# Patient Record
Sex: Female | Born: 1970 | Hispanic: No | Marital: Married | Smoking: Never smoker
Health system: Southern US, Community
[De-identification: ages and names within clinical notes are randomized; demographics above are authoritative.]

## PROBLEM LIST (undated history)

## (undated) ENCOUNTER — Inpatient Hospital Stay (HOSPITAL_COMMUNITY): Payer: Self-pay

## (undated) DIAGNOSIS — O24419 Gestational diabetes mellitus in pregnancy, unspecified control: Secondary | ICD-10-CM

## (undated) HISTORY — DX: Gestational diabetes mellitus in pregnancy, unspecified control: O24.419

---

## 2005-07-30 ENCOUNTER — Inpatient Hospital Stay (HOSPITAL_COMMUNITY): Admission: AD | Admit: 2005-07-30 | Discharge: 2005-07-30 | Payer: Self-pay | Admitting: Family Medicine

## 2005-10-20 ENCOUNTER — Ambulatory Visit (HOSPITAL_COMMUNITY): Admission: RE | Admit: 2005-10-20 | Discharge: 2005-10-20 | Payer: Self-pay | Admitting: Obstetrics & Gynecology

## 2006-03-12 ENCOUNTER — Inpatient Hospital Stay (HOSPITAL_COMMUNITY): Admission: AD | Admit: 2006-03-12 | Discharge: 2006-03-12 | Payer: Self-pay | Admitting: Obstetrics & Gynecology

## 2006-03-12 ENCOUNTER — Ambulatory Visit (HOSPITAL_COMMUNITY): Admission: RE | Admit: 2006-03-12 | Discharge: 2006-03-12 | Payer: Self-pay | Admitting: Obstetrics & Gynecology

## 2006-03-12 ENCOUNTER — Ambulatory Visit: Payer: Self-pay | Admitting: Obstetrics & Gynecology

## 2006-03-16 ENCOUNTER — Ambulatory Visit: Payer: Self-pay | Admitting: Obstetrics and Gynecology

## 2006-03-16 ENCOUNTER — Inpatient Hospital Stay (HOSPITAL_COMMUNITY): Admission: AD | Admit: 2006-03-16 | Discharge: 2006-03-18 | Payer: Self-pay | Admitting: Obstetrics and Gynecology

## 2009-08-13 ENCOUNTER — Inpatient Hospital Stay (HOSPITAL_COMMUNITY): Admission: AD | Admit: 2009-08-13 | Discharge: 2009-08-13 | Payer: Self-pay | Admitting: Obstetrics & Gynecology

## 2009-08-13 ENCOUNTER — Ambulatory Visit: Payer: Self-pay | Admitting: Obstetrics and Gynecology

## 2009-08-20 ENCOUNTER — Ambulatory Visit: Payer: Self-pay | Admitting: Family Medicine

## 2009-08-27 ENCOUNTER — Encounter (INDEPENDENT_AMBULATORY_CARE_PROVIDER_SITE_OTHER): Payer: Self-pay | Admitting: Family Medicine

## 2009-08-27 LAB — CONVERTED CEMR LAB
ALT: 13 units/L (ref 0–35)
Basophils Absolute: 0 10*3/uL (ref 0.0–0.1)
CO2: 23 meq/L (ref 19–32)
Calcium: 9.6 mg/dL (ref 8.4–10.5)
Chloride: 104 meq/L (ref 96–112)
Eosinophils Absolute: 0.4 10*3/uL (ref 0.0–0.7)
Lymphocytes Relative: 27 % (ref 12–46)
Lymphs Abs: 3.2 10*3/uL (ref 0.7–4.0)
Neutrophils Relative %: 65 % (ref 43–77)
Platelets: 414 10*3/uL — ABNORMAL HIGH (ref 150–400)
Potassium: 4.2 meq/L (ref 3.5–5.3)
Pro B Natriuretic peptide (BNP): 18.4 pg/mL (ref 0.0–100.0)
Sodium: 139 meq/L (ref 135–145)
Total Protein: 6.8 g/dL (ref 6.0–8.3)
WBC: 12 10*3/uL — ABNORMAL HIGH (ref 4.0–10.5)
hCG, Beta Chain, Quant, S: 16.6 milliintl units/mL

## 2009-09-04 ENCOUNTER — Ambulatory Visit: Payer: Self-pay | Admitting: Obstetrics & Gynecology

## 2009-09-04 ENCOUNTER — Encounter: Payer: Self-pay | Admitting: Family

## 2010-07-08 LAB — URINALYSIS, ROUTINE W REFLEX MICROSCOPIC
Glucose, UA: NEGATIVE mg/dL
Protein, ur: NEGATIVE mg/dL
pH: 5.5 (ref 5.0–8.0)

## 2010-07-08 LAB — CBC
HCT: 38.3 % (ref 36.0–46.0)
MCHC: 34.3 g/dL (ref 30.0–36.0)
MCV: 87.6 fL (ref 78.0–100.0)
Platelets: 317 10*3/uL (ref 150–400)
RDW: 13.1 % (ref 11.5–15.5)
WBC: 11.2 10*3/uL — ABNORMAL HIGH (ref 4.0–10.5)

## 2010-07-08 LAB — HCG, QUANTITATIVE, PREGNANCY: hCG, Beta Chain, Quant, S: 4458 m[IU]/mL — ABNORMAL HIGH (ref <5)

## 2010-07-08 LAB — WET PREP, GENITAL

## 2010-07-08 LAB — URINE MICROSCOPIC-ADD ON

## 2010-11-14 ENCOUNTER — Encounter (HOSPITAL_COMMUNITY): Payer: Self-pay | Admitting: *Deleted

## 2010-11-14 ENCOUNTER — Inpatient Hospital Stay (HOSPITAL_COMMUNITY)
Admission: AD | Admit: 2010-11-14 | Discharge: 2010-11-14 | Disposition: A | Discharge status: Home / Self Care | Payer: Medicaid Other | Source: Ambulatory Visit | Attending: Obstetrics & Gynecology | Admitting: Obstetrics & Gynecology

## 2010-11-14 DIAGNOSIS — Z3201 Encounter for pregnancy test, result positive: Secondary | ICD-10-CM | POA: Insufficient documentation

## 2010-11-14 LAB — POCT PREGNANCY, URINE: Preg Test, Ur: POSITIVE

## 2010-11-14 NOTE — Progress Notes (Signed)
Pt had a +UPT at home, no vaginal bleeding or pain. Pt needs a verification letter.

## 2010-11-14 NOTE — ED Provider Notes (Signed)
History   Pt presents today wishing to have preg confirmation. She denies abd pain, vag dc, bleeding, or any other sx at this time.  Chief Complaint  Patient presents with  . Possible Pregnancy   HPI  OB History    Grav Para Term Preterm Abortions TAB SAB Ect Mult Living   5 3 3  1  1   3       No past medical history on file.  No past surgical history on file.  No family history on file.  History  Substance Use Topics  . Smoking status: Never Smoker   . Smokeless tobacco: Not on file  . Alcohol Use: No    Allergies: No Known Allergies  Prescriptions prior to admission  Medication Sig Dispense Refill  . OVER THE COUNTER MEDICATION Take 1 tablet by mouth once. For vaginal itching.Marland KitchenMarland KitchenMarland KitchenDr from [place] told her to take.         Review of Systems  All other systems reviewed and are negative.   Physical Exam   Blood pressure 107/65, pulse 96, temperature 98.6 F (37 C), temperature source Oral, resp. rate 20, height 5\' 1"  (1.549 m), weight 181 lb 3.2 oz (82.192 kg), last menstrual period 08/14/2010.  Physical Exam  Nursing note and vitals reviewed. Constitutional: She is oriented to person, place, and time. She appears well-developed and well-nourished. No distress.  HENT:  Head: Normocephalic and atraumatic.  Eyes: EOM are normal. Pupils are equal, round, and reactive to light.  GI: Soft. She exhibits no distension and no mass. There is no tenderness. There is no rebound and no guarding.  Neurological: She is alert and oriented to person, place, and time.  Skin: Skin is warm and dry. She is not diaphoretic.  Psychiatric: She has a normal mood and affect. Her behavior is normal. Judgment and thought content normal.    MAU Course  Procedures  Results for orders placed during the hospital encounter of 11/14/10 (from the past 24 hour(s))  POCT PREGNANCY, URINE     Status: Normal   Collection Time   11/14/10 10:49 AM      Component Value Range   Preg Test, Ur POSITIVE        Assessment and Plan  Pregnancy: discussed with pt at length. She will begin prenatal care. Discussed diet, activity, risks, and precautions.  Clinton Gallant. Rice III, DrHSc, MPAS, PA-C  11/14/2010, 11:48 AM   Henrietta Hoover, PA 11/14/10 1150

## 2010-11-14 NOTE — Progress Notes (Signed)
Had been feeling tired.  Had + HPT yesterday.  No bleeding no pain. No nausea.

## 2010-12-24 ENCOUNTER — Other Ambulatory Visit (HOSPITAL_COMMUNITY)
Admission: RE | Admit: 2010-12-24 | Discharge: 2010-12-24 | Disposition: A | Discharge status: Home / Self Care | Payer: Medicaid Other | Source: Ambulatory Visit | Attending: Family Medicine | Admitting: Family Medicine

## 2010-12-24 ENCOUNTER — Ambulatory Visit (INDEPENDENT_AMBULATORY_CARE_PROVIDER_SITE_OTHER): Payer: Medicaid Other | Admitting: Family Medicine

## 2010-12-24 DIAGNOSIS — O24419 Gestational diabetes mellitus in pregnancy, unspecified control: Secondary | ICD-10-CM | POA: Insufficient documentation

## 2010-12-24 DIAGNOSIS — Z01419 Encounter for gynecological examination (general) (routine) without abnormal findings: Secondary | ICD-10-CM | POA: Insufficient documentation

## 2010-12-24 DIAGNOSIS — IMO0002 Reserved for concepts with insufficient information to code with codable children: Secondary | ICD-10-CM

## 2010-12-24 DIAGNOSIS — Z1272 Encounter for screening for malignant neoplasm of vagina: Secondary | ICD-10-CM

## 2010-12-24 DIAGNOSIS — O09529 Supervision of elderly multigravida, unspecified trimester: Secondary | ICD-10-CM

## 2010-12-24 DIAGNOSIS — Z3480 Encounter for supervision of other normal pregnancy, unspecified trimester: Secondary | ICD-10-CM

## 2010-12-24 HISTORY — DX: Gestational diabetes mellitus in pregnancy, unspecified control: O24.419

## 2010-12-24 LAB — POCT URINALYSIS DIP (DEVICE)
Protein, ur: 100 mg/dL — AB
Specific Gravity, Urine: >=1.03 (ref 1.005–1.030)
Urobilinogen, UA: 1 mg/dL (ref 0.0–1.0)
pH: 6 (ref 5.0–8.0)

## 2010-12-24 LAB — HIV ANTIBODY (ROUTINE TESTING W REFLEX): HIV: NONREACTIVE

## 2010-12-24 MED ORDER — FLUCONAZOLE 150 MG PO TABS: 150.0000 mg | ORAL_TABLET | Freq: Once | ORAL | Status: AC

## 2010-12-24 NOTE — Progress Notes (Signed)
Subjective:    Casey Hull is a 40 y.o. female being seen today for her first obstetrical visit. She is at [redacted]w[redacted]d gestation. Patient reports vaginal irritation and and itching. Fetal movement: normal. Patient gives history of 3 vaginal deliveries and one miscarriage. She has had no surgeries and has no medical problems. Has had normal pregnancies in the past with no history of GDM or HTN. She is from the [place], has an interpretor with her today. She has a history of female circumcision.  Menstrual History: OB History    Grav Para Term Preterm Abortions TAB SAB Ect Mult Living   5 3 3  1  1   3        Patient's last menstrual period was 08/14/2010.    Objective:    BP 105/62  Pulse 98  Temp 97.8 F (36.6 C)  Wt 81.149 kg (178 lb 14.4 oz)  LMP 08/14/2010  Breastfeeding? Unknown FHT: 158 BPM  Uterine Size: size equals dates   General: AAO, NAD Heart: RRR, no murmur Lungs: CTA B/L Abd: +BS, soft, nontender, no organomegaly Extremities: No edema, cyanosis or clubbing External Genitalia: Surgical scarring noted of clitoral area c/w history of female circumcision. Small area of fusion of upper portion of labia majora providing a small opening at the superior portion of the labia majora. External skin irritation noted with curdlike discharge. Internal Genitalia: Curdlike discharge in the vaginal vault. Cervix parous and friable when pap sample obtained. Wet prep and GC/Ch also obtained. Assessment:    Pregnancy 18 and 6/7 weeks  Advanced Maternal Age Vaginal Candidiasis Plan:  Diflucan 150mg  x 1  Signs and symptoms of preterm labor: discussed. OB/Prenatal labs today including Quad Screen Will get [place] w/ MFM for Advanced Maternal Age, routine anatomy, confirm dates Follow up in 4 weeks.

## 2010-12-25 ENCOUNTER — Other Ambulatory Visit: Payer: Self-pay | Admitting: Family Medicine

## 2010-12-25 LAB — URINALYSIS
Nitrite: NEGATIVE
Protein, ur: 30 mg/dL — AB
Specific Gravity, Urine: 1.03 (ref 1.005–1.030)
Urobilinogen, UA: 0.2 mg/dL (ref 0.0–1.0)

## 2010-12-25 LAB — OBSTETRIC PANEL
Antibody Screen: NEGATIVE
Basophils Absolute: 0 10*3/uL (ref 0.0–0.1)
Basophils Relative: 0 % (ref 0–1)
HCT: 34.8 % — ABNORMAL LOW (ref 36.0–46.0)
Hemoglobin: 11.3 g/dL — ABNORMAL LOW (ref 12.0–15.0)
Hepatitis B Surface Ag: NEGATIVE
Lymphocytes Relative: 18 % (ref 12–46)
MCHC: 32.5 g/dL (ref 30.0–36.0)
Monocytes Absolute: 0.7 10*3/uL (ref 0.1–1.0)
Monocytes Relative: 8 % (ref 3–12)
Neutro Abs: 6.9 10*3/uL (ref 1.7–7.7)
Neutrophils Relative %: 73 % (ref 43–77)
RDW: 13.7 % (ref 11.5–15.5)
Rubella: 14.6 IU/mL — ABNORMAL HIGH
WBC: 9.5 10*3/uL (ref 4.0–10.5)

## 2010-12-25 LAB — WET PREP, GENITAL
Clue Cells Wet Prep HPF POC: NONE SEEN
Trich, Wet Prep: NONE SEEN
Yeast Wet Prep HPF POC: NONE SEEN

## 2010-12-25 LAB — HIV ANTIBODY (ROUTINE TESTING W REFLEX): HIV: NONREACTIVE

## 2010-12-26 ENCOUNTER — Other Ambulatory Visit: Payer: Self-pay | Admitting: Family Medicine

## 2010-12-26 LAB — CULTURE, OB URINE: Colony Count: 85000

## 2010-12-26 LAB — HEMOGLOBINOPATHY EVALUATION
Hemoglobin Other: 0 % (ref 0.0–0.0)
Hgb S Quant: 0 % (ref 0.0–0.0)

## 2011-01-01 ENCOUNTER — Telehealth: Payer: Self-pay | Admitting: *Deleted

## 2011-01-01 NOTE — Telephone Encounter (Signed)
Called patient with [facility] [phone], to notify we made an additional appointment for her- have added appointment to her 01/06/11 at 9 for [place]- have added 1000 to see doctor in MFM  To discuss results tests/us. Pt voices understanding.

## 2011-01-06 ENCOUNTER — Ambulatory Visit (HOSPITAL_COMMUNITY)
Admission: RE | Admit: 2011-01-06 | Discharge: 2011-01-06 | Disposition: A | Discharge status: Home / Self Care | Payer: Medicaid Other | Source: Ambulatory Visit | Attending: Family Medicine | Admitting: Family Medicine

## 2011-01-06 VITALS — BP 120/72 | HR 108 | Wt 180.0 lb

## 2011-01-06 DIAGNOSIS — IMO0002 Reserved for concepts with insufficient information to code with codable children: Secondary | ICD-10-CM

## 2011-01-06 DIAGNOSIS — O09529 Supervision of elderly multigravida, unspecified trimester: Secondary | ICD-10-CM

## 2011-01-06 DIAGNOSIS — O358XX0 Maternal care for other (suspected) fetal abnormality and damage, not applicable or unspecified: Secondary | ICD-10-CM | POA: Insufficient documentation

## 2011-01-06 DIAGNOSIS — Z3480 Encounter for supervision of other normal pregnancy, unspecified trimester: Secondary | ICD-10-CM

## 2011-01-06 DIAGNOSIS — O280 Abnormal hematological finding on antenatal screening of mother: Secondary | ICD-10-CM

## 2011-01-06 DIAGNOSIS — Z1389 Encounter for screening for other disorder: Secondary | ICD-10-CM | POA: Insufficient documentation

## 2011-01-06 DIAGNOSIS — Z363 Encounter for antenatal screening for malformations: Secondary | ICD-10-CM | POA: Insufficient documentation

## 2011-01-06 NOTE — Progress Notes (Signed)
Encounter addended by: Marlana Latus, RN on: 01/06/2011  5:45 PM<BR>     Documentation filed: Episodes, Chief Complaint Section

## 2011-01-06 NOTE — Progress Notes (Signed)
Patient seen for ultrasound appointment today.  Please see [facility]OB[facility] report for details.  

## 2011-01-06 NOTE — Progress Notes (Signed)
Genetic Counseling  High-Risk Gestation Note  Appointment Date:  01/06/2011 Referred By: Casey Milroy, DO Date of Birth:  Oct 20, 1970 Partner:  Casey Hull    Pregnancy History: [id number] Estimated Date of Delivery: 05/21/11 Estimated Gestational Age: [redacted]w[redacted]d  Casey Hull and her partner, Casey Hull were seen for prenatal genetic counseling given a screen positive Down syndrome risk from Quad screen performed through [facility]. The patient is also advanced maternal age. A medical interpreter through [employer] provided interpretation during the visit.   They were counseled regarding maternal age and the association with risk for chromosome conditions due to nondisjunction with aging of the ova.   We reviewed chromosomes, nondisjunction, and the associated 1 in 28 risk for fetal aneuploidy in the second trimester related to a maternal age of 73 at delivery.  The risk for aneuploidy decreases as gestational age increases, accounting for those pregnancies which spontaneously abort.  We specifically discussed Down syndrome (trisomy 98), trisomies 84 and 31, and sex chromosome aneuploidies (47,XXX and 47,XXY) including the common features and prognoses of each.    We reviewed the results of Casey Hull's Quad screen, which increased the risk for Down syndrome from her age-related risk of 1 in 166 to 1 in 105. They understand that screening tests are used to modify a patient's a priori risk for aneuploidy, typically based on age.  This estimate provides a pregnancy specific risk assessment but is not diagnostic. This screen result was screen negative for open neural tube defects and decreased the risk for Trisomy 18 from her age-related risk of 1 in 500 to 1 in 7,802. We discussed the screening option of targeted ultrasound. They were counseled that 50-80% of fetuses with Down syndrome and up to 90% of fetuses with trisomies 13 and 18, when well visualized, have  detectable anomalies or soft markers by ultrasound. We also reviewed the availability of diagnostic option of amniocentesis. A risk of 1 in 200-300 was given for amniocentesis, the primary complication being spontaneous pregnancy loss.   We discussed the risks, limitations, and benefits of each.  We reviewed the results of her ultrasound today. Visualized fetal anatomy appeared normal at the time of today's ultrasound. After reviewing these options, Casey Hull elected to proceed with targeted ultrasound only and declined amniocentesis.  They understand that ultrasound and Quad screen cannot rule out all birth defects or genetic syndromes.   Both family histories were reviewed and found to be noncontributory  for birth defects, mental retardation, recurrent pregnancy loss, and known genetic conditions.  Without further information regarding the provided family history, an accurate genetic risk cannot be calculated.   Further genetic counseling is warranted if more information is obtained.   The patient denied exposure to environmental toxins or chemical agents.  She denied the use of alcohol, tobacco or street drugs.  She denied significant viral illnesses during the course of her pregnancy.  Her medical and surgical history were noncontributory.    A complete obstetrical ultrasound was performed at the time of today's evaluation.  The ultrasound report is reported separately.     We counseled the patient for approximately 30 minutes regarding the above risks and available options.     Casey Braun Diontre Harps, MS, [facility] 01/06/2011

## 2011-01-21 ENCOUNTER — Other Ambulatory Visit: Payer: Self-pay | Admitting: Obstetrics and Gynecology

## 2011-01-21 ENCOUNTER — Ambulatory Visit (INDEPENDENT_AMBULATORY_CARE_PROVIDER_SITE_OTHER): Payer: Medicaid Other | Admitting: Advanced Practice Midwife

## 2011-01-21 DIAGNOSIS — Z3480 Encounter for supervision of other normal pregnancy, unspecified trimester: Secondary | ICD-10-CM

## 2011-01-21 DIAGNOSIS — O289 Unspecified abnormal findings on antenatal screening of mother: Secondary | ICD-10-CM

## 2011-01-21 DIAGNOSIS — O285 Abnormal chromosomal and genetic finding on antenatal screening of mother: Secondary | ICD-10-CM | POA: Insufficient documentation

## 2011-01-21 DIAGNOSIS — O26899 Other specified pregnancy related conditions, unspecified trimester: Secondary | ICD-10-CM

## 2011-01-21 DIAGNOSIS — N949 Unspecified condition associated with female genital organs and menstrual cycle: Secondary | ICD-10-CM

## 2011-01-21 DIAGNOSIS — O9989 Other specified diseases and conditions complicating pregnancy, childbirth and the puerperium: Secondary | ICD-10-CM

## 2011-01-21 LAB — POCT URINALYSIS DIP (DEVICE)
Glucose, UA: NEGATIVE mg/dL
Ketones, ur: NEGATIVE mg/dL
Protein, ur: 100 mg/dL — AB

## 2011-01-21 MED ORDER — INFLUENZA VIRUS VACC SPLIT PF IM SUSP
0.5000 mL | Freq: Once | INTRAMUSCULAR | Status: AC
  Administered 2011-01-21: 0.5 mL via INTRAMUSCULAR

## 2011-01-21 MED ORDER — INFLUENZA VIRUS VACC SPLIT PF IM SUSP: 0.5000 mL | Freq: Once | INTRAMUSCULAR | Status: DC

## 2011-01-21 NOTE — Patient Instructions (Addendum)
Pregnancy - Second Trimester The second trimester is the period between 13 to 27 weeks of your pregnancy. It is important to follow your doctor's instructions. HOME CARE  Do not smoke.   Do not drink alcohol or use drugs.   Only take medicine the doctor tells you to take.   Take prenatal vitamins as directed. The vitamin should contain 1 milligram of folic acid.   Exercise.   Eat healthy foods. Eat regular, well-balanced meals.   You can have sex (intercourse) if there are no other problems with the pregnancy.   Do not use hot tubs, steam rooms, or saunas.   Wear a seat belt while driving.   Avoid raw meat, uncooked cheese, and litter boxes and soil used by cats.   Visit your dentist. Shirlee Limerick are okay.  GET HELP IF:  You have any concerns or worries during your pregnancy.  GET HELP RIGHT AWAY IF:  You have a temperature by mouth above 100.4, not controlled by medicine.   Fluid is coming from the vagina.   Blood is coming from the vagina. Light spotting is common, especially after sex (intercourse).   You have a bad smelling fluid (discharge) coming from the vagina. The fluid changes from clear to white.   You still feel sick to your stomach (nauseous).   You throw up (vomit) blood.   You loose or gain more than 2 pounds (0.9 kilograms) of weight over a weeks time, or as suggested by your doctor.   Your face, hands, feet, or legs get puffy (swell).   You get exposed to [place] measles and have never had them.   You get exposed to fifth disease or chicken pox.   You have belly (abdominal) pain.   You have a bad headache that will not go away.   You have watery poop (diarrhea), pain when you pee (urinate), or have shortness of breath.   You start to have problems seeing (blurry or double vision).   You fall, are in a car accident, or have any kind of trauma.   There is mental or physical violence at home.  MAKE SURE YOU:   Understand these instructions.     Will watch your condition.   Will get help right away if you are not doing well or get worse.  Document Released: 07/01/2009  [facility] Patient Information 2011 [place], [place].Deciding About Circumcision WHAT IS CIRCUMCISION? A boy is born with a sleeve of skin, with a lining of mucous membrane, that covers the head of the penis (foreskin). At birth, the foreskin is attached to the head of the penis. The foreskin can be removed shortly after birth by surgery (circumcision), or left on. If left on, the foreskin separates from the head of the penis and can be pulled back when the child is about 6 years of age. Circumcision is a surgical procedure to remove the foreskin. The following information will help you to decide whether circumcision is the correct choice for your baby. WHEN IS CIRCUMCISION DONE? Circumcision is most often done in the first couple of days of life. It can also be done later; however, when the child is out of the newborn period, circumcision requires anesthesia and costs more. If a baby is born early (premature) or is ill, circumcision should not be done until he is older or stronger. Circumcision should not be done in some instances of deformity of the penis or deformity of the opening of the penis (urethra). WHO DOES THE CIRCUMCISION?  A circumcision may be done by any number of physicians involved in newborn care. When the boy is older, circumcision is usually done by a doctor who cares for the urinary tract (urologist). Your caregiver can discuss the procedure with you and answer questions. If you decide to have your son circumcised, you will be asked to sign a consent form. YOUR OPTIONS There are reasons for and against circumcision. Caregiver's opinions vary. The choice may be based on religious, social, or cultural beliefs. Parents may want their son to be like his father or like other boys. In the end, it is your decision. ARGUMENTS FOR CIRCUMCISION  The head of the penis  is easier to wash when the foreskin is removed. This makes odor, swelling, and infection less likely.   When the foreskin is removed, it cannot get pulled back and trapped behind the head of the penis.   Some studies show that circumcised men are less likely to carry the virus for genital warts (human papillomavirus). This virus can cause cancer of the cervix in women.   Some studies also suggest that circumcised men are not as likely to get other sexually transmitted diseases (STDs), such as syphilis, human immunodeficiency virus (HIV), or gonorrhea.   Circumcised men almost never develop cancer of the penis. Some studies suggest this is because the circumcised penis is easier to keep clean.   Circumcision may reduce the risk of getting urinary infections. Studies show that germs (bacteria) are not as likely to get into the urinary tract if the foreskin is removed.  ARGUMENTS AGAINST CIRCUMCISION  The penis can easily be washed by pulling back the foreskin. Note that in the first 3 years or more, the foreskin should not be pulled back. When the penis is washed daily, odor, swelling, and infection are not likely to occur.   The chance of the foreskin ever getting trapped behind the head of the penis is very slight.   Some research shows that uncircumcised men are no more likely to get STDs than circumcised men are. Limiting the number of sexual partners and using a condom play the biggest role in preventing STDs.   Some research questions the link between men who are uncircumcised and cancer of the cervix in women.   Cancer of the penis is very rare, and it may be more closely linked to not washing the penis than to being uncircumcised.   Circumcision has risks. The penis may become infected or bleed. Too little or too much foreskin may be removed. The urinary opening may get irritated and narrowed. Scarring of the penis may occur.   The procedure is painful for the infant. Local anesthesia  should be used to avoid the pain.  It's up to you to decide about having your baby circumcised. There is no right or wrong choice. Your son can lead an active, healthy childhood and adult life regardless of your decision. Document Released: 04/03/2000 Document Re-Released: 09/24/2009 [facility] Patient Information 2011 [place], [place].  Zantac 150 mg twice a day Prevacid or Prilosec once a day

## 2011-01-21 NOTE — Progress Notes (Signed)
Pt is having some pelvic pressure. She would like to get the flu vaccine today, consent signed. Pt reports having heart burn at night that usually causes her to vomit before going to bed. Used Interpreter: Azucena Cecil

## 2011-01-21 NOTE — Progress Notes (Signed)
Pt reports normal pressure for secidn trimester. Denies cramping, vaginal discharge, LOF, VB, UTI Sx. Will culture urine due to tr hgb and large LE. PTL precautions. 1 hour GTT at [place].

## 2011-01-24 ENCOUNTER — Other Ambulatory Visit: Payer: Self-pay | Admitting: Advanced Practice Midwife

## 2011-01-24 ENCOUNTER — Telehealth: Payer: Self-pay | Admitting: Advanced Practice Midwife

## 2011-01-24 DIAGNOSIS — O2340 Unspecified infection of urinary tract in pregnancy, unspecified trimester: Secondary | ICD-10-CM | POA: Insufficient documentation

## 2011-01-24 MED ORDER — NITROFURANTOIN MONOHYD MACRO 100 MG PO CAPS: 100.0000 mg | ORAL_CAPSULE | Freq: Two times a day (BID) | ORAL | Status: AC

## 2011-01-24 NOTE — Telephone Encounter (Signed)
Urine Culture 90,000 Enterococcus. Pt symptomatic. Rx Macrobid Eprescribed. TC to inform pt of Dx and Rx.

## 2011-01-25 LAB — URINE CULTURE: Colony Count: 90000

## 2011-02-09 ENCOUNTER — Encounter: Payer: Self-pay | Admitting: Family Medicine

## 2011-02-18 ENCOUNTER — Ambulatory Visit (INDEPENDENT_AMBULATORY_CARE_PROVIDER_SITE_OTHER): Payer: Medicaid Other | Admitting: Family Medicine

## 2011-02-18 ENCOUNTER — Other Ambulatory Visit: Payer: Self-pay | Admitting: Obstetrics and Gynecology

## 2011-02-18 VITALS — BP 108/69 | Temp 97.4°F | Wt 180.9 lb

## 2011-02-18 DIAGNOSIS — O2390 Unspecified genitourinary tract infection in pregnancy, unspecified trimester: Secondary | ICD-10-CM

## 2011-02-18 DIAGNOSIS — N390 Urinary tract infection, site not specified: Secondary | ICD-10-CM

## 2011-02-18 DIAGNOSIS — O2340 Unspecified infection of urinary tract in pregnancy, unspecified trimester: Secondary | ICD-10-CM

## 2011-02-18 DIAGNOSIS — Z23 Encounter for immunization: Secondary | ICD-10-CM

## 2011-02-18 LAB — POCT URINALYSIS DIP (DEVICE)
Glucose, UA: NEGATIVE mg/dL
Nitrite: NEGATIVE
Urobilinogen, UA: 0.2 mg/dL (ref 0.0–1.0)

## 2011-02-18 LAB — CBC
MCH: 29 pg (ref 26.0–34.0)
MCV: 89 fL (ref 78.0–100.0)
Platelets: 267 10*3/uL (ref 150–400)
RDW: 15.5 % (ref 11.5–15.5)
WBC: 8.7 10*3/uL (ref 4.0–10.5)

## 2011-02-18 MED ORDER — TETANUS-DIPHTH-ACELL PERTUSSIS 5-2.5-18.5 LF-MCG/0.5 IM SUSP
0.5000 mL | Freq: Once | INTRAMUSCULAR | Status: AC
  Administered 2011-02-18: 0.5 mL via INTRAMUSCULAR

## 2011-02-18 NOTE — Patient Instructions (Signed)

## 2011-02-18 NOTE — Progress Notes (Signed)
Subjective:    Casey Hull is a 40 y.o. female being seen today for her obstetrical visit. She is at [redacted]w[redacted]d gestation. Patient reports no complaints. Fetal movement: normal. Has drank glucola and is set for draw at 10:10.  Menstrual History: OB History    Grav Para Term Preterm Abortions TAB SAB Ect Mult Living   5 3 3  1  1   3        Objective:    BP 108/69  Temp 97.4 F (36.3 C)  Wt 82.056 kg (180 lb 14.4 oz)  LMP 08/14/2010  Breastfeeding? Unknown FHT: 145-151 BPM  Uterine Size: size equals dates at 27 cm     Assessment:    Pregnancy 26 and 6/7 weeks  Advanced Maternal Age Round Ligament Pain  Plan:  Handout provided regarding Round Ligament Pain, recommend "belly belt" supporter from [employer], tylenol.  OBGCT: discussed and ordered. Signs and symptoms of preterm labor: discussed. Discussed ssx of elevated blood pressure/PreE Follow up in 4 weeks.   (or sooner if glucola is abnormal) Interpretor present from [employer]. Patient is taking a class to learn [place] and speaks some but is limited.

## 2011-02-18 NOTE — Progress Notes (Signed)
1 hr gtt today blood draw due at 1010, also needs cbc rpr Pt desires Tdap, consent signed. Used interpreter Ignatius Specking from [employer]

## 2011-02-19 ENCOUNTER — Telehealth: Payer: Self-pay | Admitting: Family Medicine

## 2011-02-19 DIAGNOSIS — Z3480 Encounter for supervision of other normal pregnancy, unspecified trimester: Secondary | ICD-10-CM

## 2011-02-19 DIAGNOSIS — O285 Abnormal chromosomal and genetic finding on antenatal screening of mother: Secondary | ICD-10-CM

## 2011-02-19 NOTE — Telephone Encounter (Signed)
Called patient using [employer] :[phone]. Spoke with patients and gave her all instructions, she will come in Friday morning at 8:00 am for her 3 hr test.

## 2011-02-19 NOTE — Telephone Encounter (Signed)
Please call patient and have her come in for 3-hour GTT. It has been ordered. Thanks!

## 2011-02-20 ENCOUNTER — Other Ambulatory Visit: Payer: Medicaid Other

## 2011-02-24 ENCOUNTER — Other Ambulatory Visit: Payer: Medicaid Other

## 2011-02-24 DIAGNOSIS — O24419 Gestational diabetes mellitus in pregnancy, unspecified control: Secondary | ICD-10-CM

## 2011-02-25 ENCOUNTER — Telehealth: Payer: Self-pay

## 2011-02-25 LAB — GLUCOSE TOLERANCE, 3 HOURS: Glucose Tolerance, 1 hour: 189 mg/dL (ref 70–189)

## 2011-02-25 NOTE — Telephone Encounter (Signed)
Called pt with interpreter # [phone] and left message to return our call to the clinics.

## 2011-02-25 NOTE — Telephone Encounter (Signed)
Message copied by Casey Hull on Wed Feb 25, 2011  1:33 PM ------      Message from: [name] A      Created: Wed Feb 25, 2011 10:32 AM       Patient has a diagnosis of gestational diabetes based on abnormal 3 hour GTT; please ensure she has an appointment for high risk clinic on Monday as she needs to obtain Diabetes Education

## 2011-03-03 NOTE — Telephone Encounter (Signed)
Pt has appt scheduled for 03/09/11 at 0745.

## 2011-03-09 ENCOUNTER — Ambulatory Visit (INDEPENDENT_AMBULATORY_CARE_PROVIDER_SITE_OTHER): Payer: Medicaid Other | Admitting: Physician Assistant

## 2011-03-09 ENCOUNTER — Encounter: Payer: Medicaid Other | Attending: Family Medicine | Admitting: Dietician

## 2011-03-09 DIAGNOSIS — N390 Urinary tract infection, site not specified: Secondary | ICD-10-CM

## 2011-03-09 DIAGNOSIS — O99810 Abnormal glucose complicating pregnancy: Secondary | ICD-10-CM | POA: Insufficient documentation

## 2011-03-09 DIAGNOSIS — O24419 Gestational diabetes mellitus in pregnancy, unspecified control: Secondary | ICD-10-CM

## 2011-03-09 DIAGNOSIS — O2340 Unspecified infection of urinary tract in pregnancy, unspecified trimester: Secondary | ICD-10-CM

## 2011-03-09 DIAGNOSIS — I2510 Atherosclerotic heart disease of native coronary artery without angina pectoris: Secondary | ICD-10-CM

## 2011-03-09 DIAGNOSIS — Z713 Dietary counseling and surveillance: Secondary | ICD-10-CM | POA: Insufficient documentation

## 2011-03-09 DIAGNOSIS — O2390 Unspecified genitourinary tract infection in pregnancy, unspecified trimester: Secondary | ICD-10-CM

## 2011-03-09 LAB — POCT URINALYSIS DIP (DEVICE)
Bilirubin Urine: NEGATIVE
Glucose, UA: NEGATIVE mg/dL
Ketones, ur: NEGATIVE mg/dL
Nitrite: NEGATIVE
Protein, ur: NEGATIVE mg/dL
Specific Gravity, Urine: >=1.03 (ref 1.005–1.030)
Urobilinogen, UA: 1 mg/dL (ref 0.0–1.0)
pH: 6 (ref 5.0–8.0)

## 2011-03-09 MED ORDER — ACCU-CHEK FASTCLIX LANCETS MISC: 1.0000 [IU] | Freq: Four times a day (QID) | Status: DC

## 2011-03-09 MED ORDER — GLUCOSE BLOOD VI STRP: ORAL_STRIP | Status: DC

## 2011-03-09 NOTE — Progress Notes (Signed)
Reviewed abnormal GTT result and diagnosis of GDM. Casey Hull today for nutrition and GDM education. FU 1 week to review BS

## 2011-03-09 NOTE — Progress Notes (Signed)
Used interpreter from [employer]. 

## 2011-03-09 NOTE — Patient Instructions (Signed)
Gestational Diabetes Mellitus Gestational diabetes mellitus (GDM) is diabetes that occurs only during pregnancy. This happens when the body cannot properly handle the glucose (sugar) that increases in the blood after eating. During pregnancy, insulin resistance (reduced sensitivity to insulin) occurs because of the release of hormones from the placenta. Usually, the pancreas of pregnant women produces enough insulin to overcome the resistance that occurs. However, in gestational diabetes, the insulin is there but it does not work effectively. If the resistance is severe enough that the pancreas does not produce enough insulin, extra glucose builds up in the blood.  WHO IS AT RISK FOR DEVELOPING GESTATIONAL DIABETES?  Women with a history of diabetes in the family.   Women over age 25.   Women who are overweight.   Women in certain ethnic groups (Hispanic, African American, Native American, Asian and Pacific Islander).  WHAT CAN HAPPEN TO THE BABY? If the mother's blood glucose is too high while she is pregnant, the extra sugar will travel through the umbilical cord to the baby. Some of the problems the baby may have are:  Large Baby - If the baby receives too much sugar, the baby will gain more weight. This may cause the baby to be too large to be born normally (vaginally) and a Cesarean section (C-section) may be needed.   Low Blood Glucose (hypoglycemia) - The baby makes extra insulin, in response to the extra sugar its gets from its mother. When the baby is born and no longer needs this extra insulin, the baby's blood glucose level may drop.   Jaundice (yellow coloring of the skin and eyes) - This is fairly common in babies. It is caused from a build-up of the chemical called bilirubin. This is rarely serious, but is seen more often in babies whose mothers had gestational diabetes.  RISKS TO THE MOTHER Women who have had gestational diabetes may be at higher risk for some problems,  including:  Preeclampsia or toxemia, which includes problems with high blood pressure. Blood pressure and protein levels in the urine must be checked frequently.   Infections.   Cesarean section (C-section) for delivery.   Developing Type 2 diabetes later in life. About 30-50% will develop diabetes later, especially if obese.  DIAGNOSIS  The hormones that cause insulin resistance are highest at about 24-28 weeks of pregnancy. If symptoms are experienced, they are much like symptoms you would normally expect during pregnancy.  GDM is often diagnosed using a two part method: 1. After 24-28 weeks of pregnancy, the woman drinks a glucose solution and takes a blood test. If the glucose level is high, a second test will be given.  2. Oral Glucose Tolerance Test (OGTT) which is 3 hours long - After not eating overnight, the blood glucose is checked. The woman drinks a glucose solution, and hourly blood glucose tests are taken.  If the woman has risk factors for GDM, the caregiver may test earlier than 24 weeks of pregnancy. TREATMENT  Treatment of GDM is directed at keeping the mother's blood glucose level normal, and may include:  Meal planning.   Taking insulin or other medicine to control your blood glucose level.   Exercise.   Keeping a daily record of the foods you eat.   Blood glucose monitoring and keeping a record of your blood glucose levels.   May monitor ketone levels in the urine, although this is no longer considered necessary in most pregnancies.  HOME CARE INSTRUCTIONS  While you are pregnant:    Follow your caregiver's advice regarding your prenatal appointments, meal planning, exercise, medicines, vitamins, blood and other tests, and physical activities.   Keep a record of your meals, blood glucose tests, and the amount of insulin you are taking (if any). Show this to your caregiver at every prenatal visit.   If you have GDM, you may have problems with hypoglycemia (low  blood glucose). You may suspect this if you become suddenly dizzy, feel shaky, and/or weak. If you think this is happening and you have a glucose meter, try to test your blood glucose level. Follow your caregiver's advice for when and how to treat your low blood glucose. Generally, the 15:15 rule is followed: Treat by consuming 15 grams of carbohydrates, wait 15 minutes, and recheck blood glucose. Examples of 15 grams of carbohydrates are:   1 cup skim or low-fat milk.    cup juice.   3-4 glucose tablets.   5-6 hard candies.   1 small box raisins.    cup regular soda pop.   Practice good hygiene, to avoid infections.   Do not smoke.  SEEK MEDICAL CARE IF:   You develop abnormal vaginal discharge, with or without itching.   You become weak and tired more than expected.   You seem to sweat a lot.   You have a sudden increase in weight, 5 pounds or more in one week.   You are losing weight, 3 pounds or more in a week.   Your blood glucose level is high, and you need instructions on what to do about it.  SEEK IMMEDIATE MEDICAL CARE IF:   You develop a severe headache.   You faint or pass out.   You develop nausea and vomiting.   You become disoriented or confused.   You have a convulsion.   You develop vision problems.   You develop stomach pain.   You develop vaginal bleeding.   You develop uterine contractions.   You have leaking or a gush of fluid from the vagina.  AFTER YOU HAVE THE BABY:  Go to all of your follow-up appointments, and have blood tests as advised by your caregiver.   Maintain a healthy lifestyle, to prevent diabetes in the future. This includes:   Following a healthy meal plan.   Controlling your weight.   Getting enough exercise and proper rest.   Do not smoke.   Breastfeed your baby if you can. This will lower the chance of you and your baby developing diabetes later in life.  For more information about diabetes, go to the American  Diabetes Association at: www.americandiabetesassociation.org. For more information about gestational diabetes, go to the American Congress of Obstetricians and Gynecologists at: www.acog.org. Document Released: 07/13/2000 Document Revised: 12/17/2010 Document Reviewed: 02/04/2009 [employer] Patient Information 2012 [employer]. 

## 2011-03-09 NOTE — Progress Notes (Signed)
Diabetes Education;  Completed Diet and meter instruction with the assistance of Arabic interpreter, Manhel Mikaelian. Completed review of diet and exchange system and carbohydrate recommendations for meals and snacks.  Provided handout Nutrition, Diabetes and Pregnancy which was in Arabic.  Provided Accu Check SmartView Meter Kit and demonstration for use.  To monitor fasting and 2 hr pp blood glucose levels daily.  Instructed to bring meter and log book to each clinic appointment.  Today, following a piece of orange earlier, her glucose is 76.  Meter lot# [id number] Expiration = 05/20/2012.  Maggie Branston Halsted, RN, RD, CDE

## 2011-03-16 ENCOUNTER — Ambulatory Visit (INDEPENDENT_AMBULATORY_CARE_PROVIDER_SITE_OTHER): Payer: Medicaid Other | Admitting: Obstetrics and Gynecology

## 2011-03-16 ENCOUNTER — Encounter: Payer: Medicaid Other | Admitting: Dietician

## 2011-03-16 ENCOUNTER — Encounter: Payer: Self-pay | Admitting: Obstetrics and Gynecology

## 2011-03-16 VITALS — BP 101/61 | Temp 97.2°F | Wt 181.5 lb

## 2011-03-16 DIAGNOSIS — O24410 Gestational diabetes mellitus in pregnancy, diet controlled: Secondary | ICD-10-CM

## 2011-03-16 DIAGNOSIS — O99810 Abnormal glucose complicating pregnancy: Secondary | ICD-10-CM

## 2011-03-16 DIAGNOSIS — R319 Hematuria, unspecified: Secondary | ICD-10-CM

## 2011-03-16 LAB — POCT URINALYSIS DIP (DEVICE)
Hgb urine dipstick: NEGATIVE
Ketones, ur: NEGATIVE mg/dL
Protein, ur: 30 mg/dL — AB
Specific Gravity, Urine: 1.015 (ref 1.005–1.030)
pH: >=9 (ref 5.0–8.0)

## 2011-03-16 NOTE — Patient Instructions (Signed)
Gestational Diabetes Mellitus Gestational diabetes mellitus (GDM) is diabetes that occurs only during pregnancy. This happens when the body cannot properly handle the glucose (sugar) that increases in the blood after eating. During pregnancy, insulin resistance (reduced sensitivity to insulin) occurs because of the release of hormones from the placenta. Usually, the pancreas of pregnant women produces enough insulin to overcome the resistance that occurs. However, in gestational diabetes, the insulin is there but it does not work effectively. If the resistance is severe enough that the pancreas does not produce enough insulin, extra glucose builds up in the blood.  WHO IS AT RISK FOR DEVELOPING GESTATIONAL DIABETES?  Women with a history of diabetes in the family.   Women over age 25.   Women who are overweight.   Women in certain ethnic groups (Hispanic, African American, Native American, Asian and Pacific Islander).  WHAT CAN HAPPEN TO THE BABY? If the mother's blood glucose is too high while she is pregnant, the extra sugar will travel through the umbilical cord to the baby. Some of the problems the baby may have are:  Large Baby - If the baby receives too much sugar, the baby will gain more weight. This may cause the baby to be too large to be born normally (vaginally) and a Cesarean section (C-section) may be needed.   Low Blood Glucose (hypoglycemia) - The baby makes extra insulin, in response to the extra sugar its gets from its mother. When the baby is born and no longer needs this extra insulin, the baby's blood glucose level may drop.   Jaundice (yellow coloring of the skin and eyes) - This is fairly common in babies. It is caused from a build-up of the chemical called bilirubin. This is rarely serious, but is seen more often in babies whose mothers had gestational diabetes.  RISKS TO THE MOTHER Women who have had gestational diabetes may be at higher risk for some problems,  including:  Preeclampsia or toxemia, which includes problems with high blood pressure. Blood pressure and protein levels in the urine must be checked frequently.   Infections.   Cesarean section (C-section) for delivery.   Developing Type 2 diabetes later in life. About 30-50% will develop diabetes later, especially if obese.  DIAGNOSIS  The hormones that cause insulin resistance are highest at about 24-28 weeks of pregnancy. If symptoms are experienced, they are much like symptoms you would normally expect during pregnancy.  GDM is often diagnosed using a two part method: 1. After 24-28 weeks of pregnancy, the woman drinks a glucose solution and takes a blood test. If the glucose level is high, a second test will be given.  2. Oral Glucose Tolerance Test (OGTT) which is 3 hours long - After not eating overnight, the blood glucose is checked. The woman drinks a glucose solution, and hourly blood glucose tests are taken.  If the woman has risk factors for GDM, the caregiver may test earlier than 24 weeks of pregnancy. TREATMENT  Treatment of GDM is directed at keeping the mother's blood glucose level normal, and may include:  Meal planning.   Taking insulin or other medicine to control your blood glucose level.   Exercise.   Keeping a daily record of the foods you eat.   Blood glucose monitoring and keeping a record of your blood glucose levels.   May monitor ketone levels in the urine, although this is no longer considered necessary in most pregnancies.  HOME CARE INSTRUCTIONS  While you are pregnant:    Follow your caregiver's advice regarding your prenatal appointments, meal planning, exercise, medicines, vitamins, blood and other tests, and physical activities.   Keep a record of your meals, blood glucose tests, and the amount of insulin you are taking (if any). Show this to your caregiver at every prenatal visit.   If you have GDM, you may have problems with hypoglycemia (low  blood glucose). You may suspect this if you become suddenly dizzy, feel shaky, and/or weak. If you think this is happening and you have a glucose meter, try to test your blood glucose level. Follow your caregiver's advice for when and how to treat your low blood glucose. Generally, the 15:15 rule is followed: Treat by consuming 15 grams of carbohydrates, wait 15 minutes, and recheck blood glucose. Examples of 15 grams of carbohydrates are:   1 cup skim or low-fat milk.    cup juice.   3-4 glucose tablets.   5-6 hard candies.   1 small box raisins.    cup regular soda pop.   Practice good hygiene, to avoid infections.   Do not smoke.  SEEK MEDICAL CARE IF:   You develop abnormal vaginal discharge, with or without itching.   You become weak and tired more than expected.   You seem to sweat a lot.   You have a sudden increase in weight, 5 pounds or more in one week.   You are losing weight, 3 pounds or more in a week.   Your blood glucose level is high, and you need instructions on what to do about it.  SEEK IMMEDIATE MEDICAL CARE IF:   You develop a severe headache.   You faint or pass out.   You develop nausea and vomiting.   You become disoriented or confused.   You have a convulsion.   You develop vision problems.   You develop stomach pain.   You develop vaginal bleeding.   You develop uterine contractions.   You have leaking or a gush of fluid from the vagina.  AFTER YOU HAVE THE BABY:  Go to all of your follow-up appointments, and have blood tests as advised by your caregiver.   Maintain a healthy lifestyle, to prevent diabetes in the future. This includes:   Following a healthy meal plan.   Controlling your weight.   Getting enough exercise and proper rest.   Do not smoke.   Breastfeed your baby if you can. This will lower the chance of you and your baby developing diabetes later in life.  For more information about diabetes, go to the American  Diabetes Association at: www.americandiabetesassociation.org. For more information about gestational diabetes, go to the American Congress of Obstetricians and Gynecologists at: www.acog.org. Document Released: 07/13/2000 Document Revised: 12/17/2010 Document Reviewed: 02/04/2009 [employer] Patient Information 2012 [employer]. 

## 2011-03-16 NOTE — Progress Notes (Signed)
Addended by: Sherre Lain A on: 03/16/2011 10:53 AM   Modules accepted: Orders

## 2011-03-16 NOTE — Progress Notes (Signed)
Used interpreter from language resources.

## 2011-03-16 NOTE — Progress Notes (Signed)
03/16/2011 Diabetes Education:  Not checking glucose consistently.  Unsure of routine.  But numbers are close to range for those that she is doing.  Mid-Wife ask that she check fasting and bary the times during the day.  With assistance of interpreter, we attempted to get her into the rotating of the glucose readings.  Will follow next week for results.  Fasting levels 95, 96, 73, 75, 90, 68,   Post breakfast = 115, 98,72,115,118,109.  Maggie Henrique Parekh, RN, RD, CDE.

## 2011-03-16 NOTE — Progress Notes (Signed)
Reviewed FBS and 1 hr pc b: all within target range. FBS today 73. Will start checking some other times of day pcl and pcd instead. Maggie to review.  S=D  No UTI sx; Tr hematuria and 30 pro: check C&S

## 2011-03-23 ENCOUNTER — Other Ambulatory Visit: Payer: Self-pay | Admitting: Obstetrics & Gynecology

## 2011-03-23 ENCOUNTER — Ambulatory Visit (INDEPENDENT_AMBULATORY_CARE_PROVIDER_SITE_OTHER): Payer: Medicaid Other | Admitting: Obstetrics & Gynecology

## 2011-03-23 DIAGNOSIS — O99810 Abnormal glucose complicating pregnancy: Secondary | ICD-10-CM

## 2011-03-23 DIAGNOSIS — O24419 Gestational diabetes mellitus in pregnancy, unspecified control: Secondary | ICD-10-CM

## 2011-03-23 LAB — POCT URINALYSIS DIP (DEVICE)
Bilirubin Urine: NEGATIVE
Glucose, UA: NEGATIVE mg/dL
Nitrite: NEGATIVE
Specific Gravity, Urine: 1.02 (ref 1.005–1.030)

## 2011-03-23 NOTE — Progress Notes (Signed)
Fastings are good.  Pt had 2 elevated post prandials after breakfast and 2 nml.  Pt supposed to check everyday after breakfast for the next 2 weeks.  Pt needs to test after lunch.  The after dinner pp are WNL. S>D--get [place] for growth.

## 2011-03-23 NOTE — Progress Notes (Signed)
Pain: lower back and pelvic area Pressure: pelvic when baby moves

## 2011-03-23 NOTE — Patient Instructions (Signed)
Oral Contraceptives Oral contraceptives (OCs) are medicines taken to prevent pregnancy. They are the most widely used method of birth control. OCs work by preventing the ovaries from releasing eggs. The OC hormones also cause the mucus on the cervix to thicken, preventing the sperm from entering the uterus. They also cause the lining of the uterus to become thin, not allowing a fertilized egg to attach to the inside of the uterus. OCs have a failure rate of less than 1%, when taken exactly as prescribed. THERE ARE 2 TYPES OF OC  OC that contains a mix of estrogen and progesterone hormones is the most common OC used. It is taken for 21 days, followed by 7 days of not taking the OC hormones. It can be packaged as 28 pills, with the last 7 pills being inactive. You take a pill every day. This way you do not need to remember when to restart taking the active pills. Most women will begin their menstrual period 2 to 3 days after taking the hormone pill. The menstrual period is usually lighter and shorter. This combination OC should not be taken if you are breast-feeding.   The progesterone only (minipill) OC does not contain estrogen. It is taken every day, continuously. You may have only spotting for a period, or no period at all. The progesterone only OC can be taken if you are breast-feeding your baby.  OCs come in:  Packs of 21 pills, with no pills to take for 7 days after the last pill.   Packs of 28 pills, with a pill to take every day. The last 7 pills are without hormones.   Packs of 91 pills (continuous or extended use), with a pill to take every day. The first 84 pills contain the hormones, and the last 7 pills do not. That is when you will have your menstrual period. You will not have a menstrual period during the time you are taking the first 84 pills.  HOW TO TAKE OC Your caregiver may advise you on how to start taking the first cycle of OCs. Otherwise, you can:  Start on day 1 or day 5 of  your menstrual period, taking the first pack of the OC. You will not need any backup contraceptive protection with this start time.   Start on the first Sunday after your menstrual period, day 7 of your menstrual period, or the day you get your prescription. In these cases, you will need backup contraceptive protection for the first cycle.  No matter which day you start the OC, you will always start a new pack on that same day of the week. It is a good idea to have an extra pack of OCs and a backup contraceptive method available, in case you miss some pills or lose your OC pack. COMMON REASONS FOR FAILURE   Forgetting to take the pill at the same time every day.   Poor absorption of the pill from the stomach into the bloodstream. This can be caused by diarrhea, vomiting, and the use of some medicines that kill germs (antibiotics).   Stomach or intestinal disease.   Taking OCs with other medicines that may make them less effective (carbamazepine, phenytoin, phenobarbital, rifampin).   Using OCs that have passed their expiration dates.   Forgetting to restart the pills on day 7, when using the packs of 21 pills.  If you forget to take 1 pill, take it as soon as you remember, and take the next pill at the  regular time. If you miss 2 or more pills, use backup birth control until your next menstrual period starts. Also, you may have vaginal spotting or bleeding when you miss 2 or more OC pills. If you use the pack of 28 pills or 91 pills, and you miss 1 of the last 7 pills (pills with no hormones), it will not matter. Just throw away the rest of the non-hormone pills and start a new 28 or 91 pill pack. COMMON USES OF OC  Decreasing premenstrual problems (symptoms).   Treating menstrual period cramps.   Avoiding becoming pregnant.   Regulating the menstrual cycle.   Treating acne.   Decreasing the heavy menstrual flow.   Treating dysfunctional (abnormal) uterine bleeding.   Treating  chronic pelvic pain.   Treating polycystic ovary syndrome (ovary does not ovulate and produces tiny cysts).   Treating endometriosis (uterus lining growing in the pelvis, tubes, and ovaries).   Can be used for emergency contraception.  OCs DO NOT prevent sexually transmitted diseases (STDs). Safer sex practices, such as using condoms along with the pill, can help prevent STDs.  BENEFITS  OC reduces the risk of:   Cancer of the ovary and uterus.   Ovarian cysts.   Pelvic infection.   Symptoms of polycystic ovary syndrome.   Loss of bone (osteoporosis).   Noncancerous (benign) breast disease (fibrocystic breast changes).   Lack of red blood cells (anemia) from heavy or long menstrual periods.   Pregnancy occurring outside the uterus (tubal pregnancy).   Acne.   Slows down the flow of heavy menstrual periods.   Sometimes helps control premenstrual syndrome (PMS).   Stops menstrual cramps and pain.   Controls irregular menstrual periods.   Can be used as emergency contraception.  YOU SHOULD NOT TAKE THE PILL IF YOU:  Are pregnant, or are trying to get pregnant.   Have unexplained or abnormal vaginal bleeding.   Have a history of liver disease, stroke, or heart attack.   Smoke.   Have a history of blood clots, cancer, or heart problems.   Have gallbladder disease.   Have breast cancer or suspect breast cancer.   Have or suspect pelvic cancer.   Have high blood pressure.   Have high cholesterol or high triglycerides.   Have mental depression.   Are breast-feeding, except for the progesterone only OC, with approval of your caregiver.   Have diabetes with kidney, eye, or other blood vessel complications. Or if you have diabetes for 20 years or more.   Have heart valve disease.   Have migraine headaches. They may get worse.  Before taking the pill, a woman will have a physical exam and Pap test. Your caregiver may order blood tests to check blood sugar and  cholesterol levels, and other blood tests that may be necessary. SIDE EFFECTS OF THE PILL MAY INCLUDE:  Breast tenderness, pain and discharge.   Change in sex drive (increased or decreased libido).   Depression.   Being tired often.   Headaches.   Anxiety.   Irregular spotting or vaginal bleeding for a couple of months.   Leg pain.   Cramps, or swelling of your limbs (extremities).   Mood swings.   Weight loss or weight gain.   Feeling sick to your stomach (nausea).   Change in appetite (hunger).   Loss of hair.   Yeast or fungus vaginal infection.   Nervousness.   Rash.   Acne.   No menstrual period (amenorrhea).  When  starting an OC, it is usually best to allow 2-3 months, if possible, for the body to adjust (before stopping because of side effects). This allows for adjustment to the changes in hormone levels. If a woman continues to have side effects, it may be possible to change to a different OC. It is important to discuss side effects with your caregiver. Often, changing to a different pill causes the side effects to subside. RISKS AND COMPLICATIONS   Blood clots of the leg, heart, lung, or brain.   High blood pressure.   Gallbladder disease.   Liver tumors.   Brain bleeding (hemorrhage).   Slight risk of breast cancer.  HOME CARE INSTRUCTIONS   Do not smoke.   Only take over-the-counter or prescription medicines for pain, discomfort, fever, or breast tenderness as directed by your caregiver.   Always use a condom to protect against sexually transmitted disease. OCs do not protect against STDs.   Keep a calendar, marking your menstrual period days.  Recommendations, types, and dosages of OC use change continually. Discuss your choices with your caregiver, and decide what is best for you. There are always exceptions to guidelines. You should always read the information that comes with the OC, and check whether there are any new recommendations or  guidelines. SEEK MEDICAL CARE IF:   You develop nausea and vomiting from the OC.   You have abnormal vaginal discharge.   You need treatment for headaches.   You develop a rash.   You miss your menstrual period.   You develop abnormal vaginal bleeding.   You are losing your hair.   You need treatment for mood swings or depression.   You get dizzy when taking the OC.   You develop acne from taking the OC.  SEEK IMMEDIATE MEDICAL CARE IF:   You develop leg pain.   You develop chest pain.   You develop shortness of breath.   You develop abdominal pain.   You have an uncontrolled headache.   You develop numbness or slurred speech.   You develop visual problems (loss of vision, double, or blurry vision).   You develop heavy vaginal bleeding.  If you are taking the pill, STOP RIGHT AWAY and CALL YOUR CAREGIVER IMMEDIATELY if the following occur:  You develop chest pain and shortness of breath.   You develop pain, redness, and swelling in the legs.   You develop severe headaches, visual changes, or belly (abdominal) pain.   You develop severe depression.   You become pregnant.  Document Released: 06/27/2002 Document Revised: 05/09/2010 Document Reviewed: 04/18/2009 [facility] Patient Information 2012 [place], [place].Breastfeeding BENEFITS OF BREASTFEEDING For the baby  The first milk (colostrum) helps the baby's digestive system function better.   There are antibodies from the mother in the milk that help the baby fight off infections.   The baby has a lower incidence of asthma, allergies, and SIDS (sudden infant death syndrome).   The nutrients in breast milk are better than formulas for the baby and helps the baby's brain grow better.   Babies who breastfeed have less gas, colic, and constipation.  For the mother  Breastfeeding helps develop a very special bond between mother and baby.   It is more convenient, always available at the correct temperature  and cheaper than formula feeding.   It burns calories in the mother and helps with losing weight that was gained during pregnancy.   It makes the uterus contract back down to normal size faster and slows  bleeding following delivery.   Breastfeeding mothers have a lower risk of developing breast cancer.  NURSE FREQUENTLY  A healthy, full-term baby may breastfeed as often as every hour or space his or her feedings to every 3 hours.   How often to nurse will vary from baby to baby. Watch your baby for signs of hunger, not the clock.   Nurse as often as the baby requests, or when you feel the need to reduce the fullness of your breasts.   Awaken the baby if it has been 3 to 4 hours since the last feeding.   Frequent feeding will help the mother make more milk and will prevent problems like sore nipples and engorgement of the breasts.  BABY'S POSITION AT THE BREAST  Whether lying down or sitting, be sure that the baby's tummy is facing your tummy.   Support the breast with 4 fingers underneath the breast and the thumb above. Make sure your fingers are well away from the nipple and baby's mouth.   Stroke the baby's lips and cheek closest to the breast gently with your finger or nipple.   When the baby's mouth is open wide enough, place all of your nipple and as much of the dark area around the nipple as possible into your baby's mouth.   Pull the baby in close so the tip of the nose and the baby's cheeks touch the breast during the feeding.  FEEDINGS  The length of each feeding varies from baby to baby and from feeding to feeding.   The baby must suck about 2 to 3 minutes for your milk to get to him or her. This is called a "let down." For this reason, allow the baby to feed on each breast as long as he or she wants. Your baby will end the feeding when he or she has received the right balance of nutrients.   To break the suction, put your finger into the corner of the baby's mouth and  slide it between his or her gums before removing your breast from his or her mouth. This will help prevent sore nipples.  REDUCING BREAST ENGORGEMENT  In the first week after your baby is born, you may experience signs of breast engorgement. When breasts are engorged, they feel heavy, warm, full, and may be tender to the touch. You can reduce engorgement if you:   Nurse frequently, every 2 to 3 hours. Mothers who breastfeed early and often have fewer problems with engorgement.   Place light ice packs on your breasts between feedings. This reduces swelling. Wrap the ice packs in a lightweight towel to protect your skin.   Apply moist hot packs to your breast for 5 to 10 minutes before each feeding. This increases circulation and helps the milk flow.   Gently massage your breast before and during the feeding.   Make sure that the baby empties at least one breast at every feeding before switching sides.   Use a breast pump to empty the breasts if your baby is sleepy or not nursing well. You may also want to pump if you are returning to work or or you feel you are getting engorged.   Avoid bottle feeds, pacifiers or supplemental feedings of water or juice in place of breastfeeding.   Be sure the baby is latched on and positioned properly while breastfeeding.   Prevent fatigue, stress, and anemia.   Wear a supportive bra, avoiding underwire styles.   Eat a balanced diet  with enough fluids.  If you follow these suggestions, your engorgement should improve in 24 to 48 hours. If you are still experiencing difficulty, call your lactation consultant or caregiver. IS MY BABY GETTING ENOUGH MILK? Sometimes, mothers worry about whether their babies are getting enough milk. You can be assured that your baby is getting enough milk if:  The baby is actively sucking and you hear swallowing.   The baby nurses at least 8 to 12 times in a 24 hour time period. Nurse your baby until he or she unlatches or  falls asleep at the first breast (at least 10 to 20 minutes), then offer the second side.   The baby is wetting 5 to 6 disposable diapers (6 to 8 cloth diapers) in a 24 hour period by 89 to 9 days of age.   The baby is having at least 2 to 3 stools every 24 hours for the first few months. Breast milk is all the food your baby needs. It is not necessary for your baby to have water or formula. In fact, to help your breasts make more milk, it is best not to give your baby supplemental feedings during the early weeks.   The stool should be soft and yellow.   The baby should gain 4 to 7 ounces per week after he is 66 days old.  TAKE CARE OF YOURSELF Take care of your breasts by:  Bathing or showering daily.   Avoiding the use of soaps on your nipples.   Start feedings on your left breast at one feeding and on your right breast at the next feeding.   You will notice an increase in your milk supply 2 to 5 days after delivery. You may feel some discomfort from engorgement, which makes your breasts very firm and often tender. Engorgement "peaks" out within 24 to 48 hours. In the meantime, apply warm moist towels to your breasts for 5 to 10 minutes before feeding. Gentle massage and expression of some milk before feeding will soften your breasts, making it easier for your baby to latch on. Wear a well fitting nursing bra and air dry your nipples for 10 to 15 minutes after each feeding.   Only use cotton bra pads.   Only use pure lanolin on your nipples after nursing. You do not need to wash it off before nursing.  Take care of yourself by:   Eating well-balanced meals and nutritious snacks.   Drinking milk, fruit juice, and water to satisfy your thirst (about 8 glasses a day).   Getting plenty of rest.   Increasing calcium in your diet (1200 mg a day).   Avoiding foods that you notice affect the baby in a bad way.  SEEK MEDICAL CARE IF:   You have any questions or difficulty with  breastfeeding.   You need help.   You have a hard, red, sore area on your breast, accompanied by a fever of 100.5 F (38.1 C) or more.   Your baby is too sleepy to eat well or is having trouble sleeping.   Your baby is wetting less than 6 diapers per day, by 45 days of age.   Your baby's skin or white part of his or her eyes is more yellow than it was in the hospital.   You feel depressed.  Document Released: 04/06/2005 Document Revised: 12/17/2010 Document Reviewed: 11/19/2008 [facility] Patient Information 2012 [place], [place].

## 2011-03-23 NOTE — Progress Notes (Signed)
U/S scheduled 03/25/11 at 9 am.

## 2011-03-25 ENCOUNTER — Ambulatory Visit (HOSPITAL_COMMUNITY)
Admission: RE | Admit: 2011-03-25 | Discharge: 2011-03-25 | Disposition: A | Discharge status: Home / Self Care | Payer: Medicaid Other | Source: Ambulatory Visit | Attending: Obstetrics & Gynecology | Admitting: Obstetrics & Gynecology

## 2011-03-25 DIAGNOSIS — O99810 Abnormal glucose complicating pregnancy: Secondary | ICD-10-CM | POA: Insufficient documentation

## 2011-03-25 DIAGNOSIS — O3660X0 Maternal care for excessive fetal growth, unspecified trimester, not applicable or unspecified: Secondary | ICD-10-CM | POA: Insufficient documentation

## 2011-03-25 DIAGNOSIS — O09529 Supervision of elderly multigravida, unspecified trimester: Secondary | ICD-10-CM | POA: Insufficient documentation

## 2011-03-25 DIAGNOSIS — O24419 Gestational diabetes mellitus in pregnancy, unspecified control: Secondary | ICD-10-CM

## 2011-04-06 ENCOUNTER — Ambulatory Visit (INDEPENDENT_AMBULATORY_CARE_PROVIDER_SITE_OTHER): Payer: Medicaid Other | Admitting: Obstetrics & Gynecology

## 2011-04-06 DIAGNOSIS — O24419 Gestational diabetes mellitus in pregnancy, unspecified control: Secondary | ICD-10-CM

## 2011-04-06 DIAGNOSIS — O0990 Supervision of high risk pregnancy, unspecified, unspecified trimester: Secondary | ICD-10-CM | POA: Insufficient documentation

## 2011-04-06 DIAGNOSIS — O09529 Supervision of elderly multigravida, unspecified trimester: Secondary | ICD-10-CM

## 2011-04-06 DIAGNOSIS — O289 Unspecified abnormal findings on antenatal screening of mother: Secondary | ICD-10-CM

## 2011-04-06 DIAGNOSIS — O99810 Abnormal glucose complicating pregnancy: Secondary | ICD-10-CM

## 2011-04-06 DIAGNOSIS — O285 Abnormal chromosomal and genetic finding on antenatal screening of mother: Secondary | ICD-10-CM

## 2011-04-06 DIAGNOSIS — IMO0002 Reserved for concepts with insufficient information to code with codable children: Secondary | ICD-10-CM

## 2011-04-06 LAB — POCT URINALYSIS DIP (DEVICE)
Bilirubin Urine: NEGATIVE
Ketones, ur: NEGATIVE mg/dL
pH: 7 (ref 5.0–8.0)

## 2011-04-06 NOTE — Progress Notes (Signed)
BS within normal range.  Continue diet control.  No other complaints or concerns.  Fetal movement and labor precautions reviewed.  Plans to breastfeed, considering IUD for contraception.

## 2011-04-06 NOTE — Patient Instructions (Signed)
Breastfeeding BENEFITS OF BREASTFEEDING For the baby  The first milk (colostrum) helps the baby's digestive system function better.   There are antibodies from the mother in the milk that help the baby fight off infections.   The baby has a lower incidence of asthma, allergies, and SIDS (sudden infant death syndrome).   The nutrients in breast milk are better than formulas for the baby and helps the baby's brain grow better.   Babies who breastfeed have less gas, colic, and constipation.  For the mother  Breastfeeding helps develop a very special bond between mother and baby.   It is more convenient, always available at the correct temperature and cheaper than formula feeding.   It burns calories in the mother and helps with losing weight that was gained during pregnancy.   It makes the uterus contract back down to normal size faster and slows bleeding following delivery.   Breastfeeding mothers have a lower risk of developing breast cancer.  NURSE FREQUENTLY  A healthy, full-term baby may breastfeed as often as every hour or space his or her feedings to every 3 hours.   How often to nurse will vary from baby to baby. Watch your baby for signs of hunger, not the clock.   Nurse as often as the baby requests, or when you feel the need to reduce the fullness of your breasts.   Awaken the baby if it has been 3 to 4 hours since the last feeding.   Frequent feeding will help the mother make more milk and will prevent problems like sore nipples and engorgement of the breasts.  BABY'S POSITION AT THE BREAST  Whether lying down or sitting, be sure that the baby's tummy is facing your tummy.   Support the breast with 4 fingers underneath the breast and the thumb above. Make sure your fingers are well away from the nipple and baby's mouth.   Stroke the baby's lips and cheek closest to the breast gently with your finger or nipple.   When the baby's mouth is open wide enough, place  all of your nipple and as much of the dark area around the nipple as possible into your baby's mouth.   Pull the baby in close so the tip of the nose and the baby's cheeks touch the breast during the feeding.  FEEDINGS  The length of each feeding varies from baby to baby and from feeding to feeding.   The baby must suck about 2 to 3 minutes for your milk to get to him or her. This is called a "let down." For this reason, allow the baby to feed on each breast as long as he or she wants. Your baby will end the feeding when he or she has received the right balance of nutrients.   To break the suction, put your finger into the corner of the baby's mouth and slide it between his or her gums before removing your breast from his or her mouth. This will help prevent sore nipples.  REDUCING BREAST ENGORGEMENT  In the first week after your baby is born, you may experience signs of breast engorgement. When breasts are engorged, they feel heavy, warm, full, and may be tender to the touch. You can reduce engorgement if you:   Nurse frequently, every 2 to 3 hours. Mothers who breastfeed early and often have fewer problems with engorgement.   Place light ice packs on your breasts between feedings. This reduces swelling. Wrap the ice packs in a   lightweight towel to protect your skin.   Apply moist hot packs to your breast for 5 to 10 minutes before each feeding. This increases circulation and helps the milk flow.   Gently massage your breast before and during the feeding.   Make sure that the baby empties at least one breast at every feeding before switching sides.   Use a breast pump to empty the breasts if your baby is sleepy or not nursing well. You may also want to pump if you are returning to work or or you feel you are getting engorged.   Avoid bottle feeds, pacifiers or supplemental feedings of water or juice in place of breastfeeding.   Be sure the baby is latched on and positioned properly while  breastfeeding.   Prevent fatigue, stress, and anemia.   Wear a supportive bra, avoiding underwire styles.   Eat a balanced diet with enough fluids.  If you follow these suggestions, your engorgement should improve in 24 to 48 hours. If you are still experiencing difficulty, call your lactation consultant or caregiver. IS MY BABY GETTING ENOUGH MILK? Sometimes, mothers worry about whether their babies are getting enough milk. You can be assured that your baby is getting enough milk if:  The baby is actively sucking and you hear swallowing.   The baby nurses at least 8 to 12 times in a 24 hour time period. Nurse your baby until he or she unlatches or falls asleep at the first breast (at least 10 to 20 minutes), then offer the second side.   The baby is wetting 5 to 6 disposable diapers (6 to 8 cloth diapers) in a 24 hour period by 5 to 6 days of age.   The baby is having at least 2 to 3 stools every 24 hours for the first few months. Breast milk is all the food your baby needs. It is not necessary for your baby to have water or formula. In fact, to help your breasts make more milk, it is best not to give your baby supplemental feedings during the early weeks.   The stool should be soft and yellow.   The baby should gain 4 to 7 ounces per week after he is 4 days old.  TAKE CARE OF YOURSELF Take care of your breasts by:  Bathing or showering daily.   Avoiding the use of soaps on your nipples.   Start feedings on your left breast at one feeding and on your right breast at the next feeding.   You will notice an increase in your milk supply 2 to 5 days after delivery. You may feel some discomfort from engorgement, which makes your breasts very firm and often tender. Engorgement "peaks" out within 24 to 48 hours. In the meantime, apply warm moist towels to your breasts for 5 to 10 minutes before feeding. Gentle massage and expression of some milk before feeding will soften your breasts, making  it easier for your baby to latch on. Wear a well fitting nursing bra and air dry your nipples for 10 to 15 minutes after each feeding.   Only use cotton bra pads.   Only use pure lanolin on your nipples after nursing. You do not need to wash it off before nursing.  Take care of yourself by:   Eating well-balanced meals and nutritious snacks.   Drinking milk, fruit juice, and water to satisfy your thirst (about 8 glasses a day).   Getting plenty of rest.   Increasing calcium in   your diet (1200 mg a day).   Avoiding foods that you notice affect the baby in a bad way.  SEEK MEDICAL CARE IF:   You have any questions or difficulty with breastfeeding.   You need help.   You have a hard, red, sore area on your breast, accompanied by a fever of 100.5 F (38.1 C) or more.   Your baby is too sleepy to eat well or is having trouble sleeping.   Your baby is wetting less than 6 diapers per day, by 5 days of age.   Your baby's skin or white part of his or her eyes is more yellow than it was in the hospital.   You feel depressed.  Document Released: 04/06/2005 Document Revised: 12/17/2010 Document Reviewed: 11/19/2008 [employer] Patient Information 2012 [employer]. 

## 2011-04-06 NOTE — Progress Notes (Signed)
Use interpreter from [employer]

## 2011-04-20 ENCOUNTER — Ambulatory Visit (INDEPENDENT_AMBULATORY_CARE_PROVIDER_SITE_OTHER): Payer: Medicaid Other | Admitting: Family Medicine

## 2011-04-20 DIAGNOSIS — O24419 Gestational diabetes mellitus in pregnancy, unspecified control: Secondary | ICD-10-CM

## 2011-04-20 DIAGNOSIS — O99810 Abnormal glucose complicating pregnancy: Secondary | ICD-10-CM

## 2011-04-20 LAB — POCT URINALYSIS DIP (DEVICE)
Protein, ur: 30 mg/dL — AB
Specific Gravity, Urine: 1.025 (ref 1.005–1.030)

## 2011-04-20 MED ORDER — OMEPRAZOLE MAGNESIUM 20 MG PO TBEC: 20.0000 mg | DELAYED_RELEASE_TABLET | Freq: Every day | ORAL | Status: DC

## 2011-04-20 NOTE — Progress Notes (Signed)
FBS 73-95 2hr pp 83-155 3/9 out of range--notes bread making BS too high Exercising some--advised walking 30 mins/day--dietary adjustments.

## 2011-04-20 NOTE — Patient Instructions (Signed)
Pregnancy - Third Trimester The third trimester of pregnancy (the last 3 months) is a period of the most rapid growth for you and your baby. The baby approaches a length of 20 inches and a weight of 6 to 10 pounds. The baby is adding on fat and getting ready for life outside your body. While inside, babies have periods of sleeping and waking, suck their thumbs, and hiccups. You can often feel small contractions of the uterus. This is false labor. It is also called Braxton-Hicks contractions. This is like a practice for labor. The usual problems in this stage of pregnancy include more difficulty breathing, swelling of the hands and feet from water retention, and having to urinate more often because of the uterus and baby pressing on your bladder.  PRENATAL EXAMS  Blood work may continue to be done during prenatal exams. These tests are done to check on your health and the probable health of your baby. Blood work is used to follow your blood levels (hemoglobin). Anemia (low hemoglobin) is common during pregnancy. Iron and vitamins are given to help prevent this. You may also continue to be checked for diabetes. Some of the past blood tests may be done again.   The size of the uterus is measured during each visit. This makes sure your baby is growing properly according to your pregnancy dates.   Your blood pressure is checked every prenatal visit. This is to make sure you are not getting toxemia.   Your urine is checked every prenatal visit for infection, diabetes and protein.   Your weight is checked at each visit. This is done to make sure gains are happening at the suggested rate and that you and your baby are growing normally.   Sometimes, an ultrasound is performed to confirm the position and the proper growth and development of the baby. This is a test done that bounces harmless sound waves off the baby so your caregiver can more accurately determine due dates.   Discuss the type of pain  medication and anesthesia you will have during your labor and delivery.   Discuss the possibility and anesthesia if a Cesarean Section might be necessary.   Inform your caregiver if there is any mental or physical violence at home.  Sometimes, a specialized non-stress test, contraction stress test and biophysical profile are done to make sure the baby is not having a problem. Checking the amniotic fluid surrounding the baby is called an amniocentesis. The amniotic fluid is removed by sticking a needle into the belly (abdomen). This is sometimes done near the end of pregnancy if an early delivery is required. In this case, it is done to help make sure the baby's lungs are mature enough for the baby to live outside of the womb. If the lungs are not mature and it is unsafe to deliver the baby, an injection of cortisone medication is given to the mother 1 to 2 days before the delivery. This helps the baby's lungs mature and makes it safer to deliver the baby. CHANGES OCCURING IN THE THIRD TRIMESTER OF PREGNANCY Your body goes through many changes during pregnancy. They vary from person to person. Talk to your caregiver about changes you notice and are concerned about.  During the last trimester, you have probably had an increase in your appetite. It is normal to have cravings for certain foods. This varies from person to person and pregnancy to pregnancy.   You may begin to get stretch marks on your hips,   abdomen, and breasts. These are normal changes in the body during pregnancy. There are no exercises or medications to take which prevent this change.   Constipation may be treated with a stool softener or adding bulk to your diet. Drinking lots of fluids, fiber in vegetables, fruits, and whole grains are helpful.   Exercising is also helpful. If you have been very active up until your pregnancy, most of these activities can be continued during your pregnancy. If you have been less active, it is helpful  to start an exercise program such as walking. Consult your caregiver before starting exercise programs.   Avoid all smoking, alcohol, un-prescribed drugs, herbs and "street drugs" during your pregnancy. These chemicals affect the formation and growth of the baby. Avoid chemicals throughout the pregnancy to ensure the delivery of a healthy infant.   Backache, varicose veins and hemorrhoids may develop or get worse.   You will tire more easily in the third trimester, which is normal.   The baby's movements may be stronger and more often.   You may become short of breath easily.   Your belly button may stick out.   A yellow discharge may leak from your breasts called colostrum.   You may have a bloody mucus discharge. This usually occurs a few days to a week before labor begins.  HOME CARE INSTRUCTIONS   Keep your caregiver's appointments. Follow your caregiver's instructions regarding medication use, exercise, and diet.   During pregnancy, you are providing food for you and your baby. Continue to eat regular, well-balanced meals. Choose foods such as meat, fish, milk and other low fat dairy products, vegetables, fruits, and whole-grain breads and cereals. Your caregiver will tell you of the ideal weight gain.   A physical sexual relationship may be continued throughout pregnancy if there are no other problems such as early (premature) leaking of amniotic fluid from the membranes, vaginal bleeding, or belly (abdominal) pain.   Exercise regularly if there are no restrictions. Check with your caregiver if you are unsure of the safety of your exercises. Greater weight gain will occur in the last 2 trimesters of pregnancy. Exercising helps:   Control your weight.   Get you in shape for labor and delivery.   You lose weight after you deliver.   Rest a lot with legs elevated, or as needed for leg cramps or low back pain.   Wear a good support or jogging bra for breast tenderness during  pregnancy. This may help if worn during sleep. Pads or tissues may be used in the bra if you are leaking colostrum.   Do not use hot tubs, steam rooms, or saunas.   Wear your seat belt when driving. This protects you and your baby if you are in an accident.   Avoid raw meat, cat litter boxes and soil used by cats. These carry germs that can cause birth defects in the baby.   It is easier to loose urine during pregnancy. Tightening up and strengthening the pelvic muscles will help with this problem. You can practice stopping your urination while you are going to the bathroom. These are the same muscles you need to strengthen. It is also the muscles you would use if you were trying to stop from passing gas. You can practice tightening these muscles up 10 times a set and repeating this about 3 times per day. Once you know what muscles to tighten up, do not perform these exercises during urination. It is more likely   to cause an infection by backing up the urine.   Ask for help if you have financial, counseling or nutritional needs during pregnancy. Your caregiver will be able to offer counseling for these needs as well as refer you for other special needs.   Make a list of emergency phone numbers and have them available.   Plan on getting help from family or friends when you go home from the hospital.   Make a trial run to the hospital.   Take prenatal classes with the father to understand, practice and ask questions about the labor and delivery.   Prepare the baby's room/nursery.   Do not travel out of the city unless it is absolutely necessary and with the advice of your caregiver.   Wear only low or no heal shoes to have better balance and prevent falling.  MEDICATIONS AND DRUG USE IN PREGNANCY  Take prenatal vitamins as directed. The vitamin should contain 1 milligram of folic acid. Keep all vitamins out of reach of children. Only a couple vitamins or tablets containing iron may be fatal  to a baby or young child when ingested.   Avoid use of all medications, including herbs, over-the-counter medications, not prescribed or suggested by your caregiver. Only take over-the-counter or prescription medicines for pain, discomfort, or fever as directed by your caregiver. Do not use aspirin, ibuprofen (Motrin, Advil, Nuprin) or naproxen (Aleve) unless OK'd by your caregiver.   Let your caregiver also know about herbs you may be using.   Alcohol is related to a number of birth defects. This includes fetal alcohol syndrome. All alcohol, in any form, should be avoided completely. Smoking will cause low birth rate and premature babies.   Street/illegal drugs are very harmful to the baby. They are absolutely forbidden. A baby born to an addicted mother will be addicted at birth. The baby will go through the same withdrawal an adult does.  SEEK MEDICAL CARE IF: You have any concerns or worries during your pregnancy. It is better to call with your questions if you feel they cannot wait, rather than worry about them. DECISIONS ABOUT CIRCUMCISION You may or may not know the sex of your baby. If you know your baby is a boy, it may be time to think about circumcision. Circumcision is the removal of the foreskin of the penis. This is the skin that covers the sensitive end of the penis. There is no proven medical need for this. Often this decision is made on what is popular at the time or based upon religious beliefs and social issues. You can discuss these issues with your caregiver or pediatrician. SEEK IMMEDIATE MEDICAL CARE IF:   An unexplained oral temperature above 102 F (38.9 C) develops, or as your caregiver suggests.   You have leaking of fluid from the vagina (birth canal). If leaking membranes are suspected, take your temperature and tell your caregiver of this when you call.   There is vaginal spotting, bleeding or passing clots. Tell your caregiver of the amount and how many pads are  used.   You develop a bad smelling vaginal discharge with a change in the color from clear to white.   You develop vomiting that lasts more than 24 hours.   You develop chills or fever.   You develop shortness of breath.   You develop burning on urination.   You loose more than 2 pounds of weight or gain more than 2 pounds of weight or as suggested by your   caregiver.   You notice sudden swelling of your face, hands, and feet or legs.   You develop belly (abdominal) pain. Round ligament discomfort is a common non-cancerous (benign) cause of abdominal pain in pregnancy. Your caregiver still must evaluate you.   You develop a severe headache that does not go away.   You develop visual problems, blurred or double vision.   If you have not felt your baby move for more than 1 hour. If you think the baby is not moving as much as usual, eat something with sugar in it and lie down on your left side for an hour. The baby should move at least 4 to 5 times per hour. Call right away if your baby moves less than that.   You fall, are in a car accident or any kind of trauma.   There is mental or physical violence at home.  Document Released: 03/31/2001 Document Revised: 12/17/2010 Document Reviewed: 10/03/2008 [employer] Patient Information 2012 [employer]. Birth Control Choices Birth control is the use of any practices, methods, or devices to prevent pregnancy from happening in a sexually active woman.  Below are some birth control choices to help avoid pregnancy.  Not having sex (abstinence) is the surest form of birth control. This requires self-control. There is no risk of acquiring a sexually transmitted disease (STD), including acquired immunodeficiency syndrome (AIDS).   Periodic abstinence requires self-control during certain times of the month.   Calendar method, timing your menstrual periods from month to month.   Ovulation method is avoiding sexual intercourse around the time  you produce an egg (ovulate).   Symptotherm method is avoiding sexual intercourse at the time of ovulation, using a thermometer and ovulation symptoms.   Post ovulation method is the timing of sexual intercourse after you ovulated.  These methods do not protect against STDs, including AIDS.  Birth control pills (BCPs) contain estrogen and progesterone hormone. These medicines work by stopping the egg from forming in the ovary (ovulation). Birth control pills are prescribed by a caregiver who will ask you questions about the risks of taking BCPs. Birth control pills do not protect against STDs, including AIDS.   "Minipill" birth control pills have only the progesterone hormone. They are taken every day of each month and must be prescribed by your caregiver. They do not protect against STDs, including AIDS.   Emergency contraception is often call the "morning after" pill. This pill can be taken right after sex or up to five days after sex if you think your birth control failed, you failed to use contraception, or you were forced to have sex. It is most effective the sooner you take the pills after having sexual intercourse. Do not use emergency contraception as your only form of birth control. Emergency contraceptive pills are available without a prescription. Check with your pharmacist.   Condoms are a thin sheath of latex, synthetic material, or lambskin worn over the penis during sexual intercourse. They can have a spermicide in or on them when you buy them. Latex condoms can prevent pregnancy and STDs. "Natural" or lambskin condoms can prevent pregnancy but may not protect against STDs, including AIDS.   Female condoms are a soft, loose-fitting sheath that is put into the vagina before sexual intercourse. They can prevent pregnancy and STDs, including AIDS.   Sponge is a soft, circular piece of polyurethane foam with spermicide in it that is inserted into the vagina after wetting it and before  sexual intercourse. It does   not require a prescription from your caregiver. It does not protect against STDs, including AIDS.   Diaphragm is a soft, latex, dome-shaped barrier that must be fitted by a caregiver. It is inserted into the vagina, along with a spermicidal jelly. After the proper fitting for a diaphragm, always insert the diaphragm before intercourse. The diaphragm should be left in the vagina for 6 to 8 hours after intercourse. Removal and reinsertion with a spermicide is always necessary after any use. It does not protect against STDs, including AIDS.   Progesterone-only injections are given every 3 months to prevent pregnancy. These injections contain synthetic progesterone and no estrogen. This hormone stops the ovaries from releasing eggs. It also causes the cervical mucus to thicken and changes the uterine lining. This makes it harder for sperm to survive in the uterus. It does not protect against STDs, including AIDS.   Birth Control Patch contains hormones similar to those in birth control pills, so effectiveness, risks, and side effects are similar. It must be changed once a week and is prescribed by a caregiver. It is less effective in very overweight women. It does not protect against STDs, including AIDS.   Vaginal Ring contains hormones similar to those in birth control pills. It is left in place for 3 weeks, removed for 1 week, and then a new one is put back into the vagina. It comes with a timer to put in your purse to help you remember when to take it out or put a new one in. A caregiver's examination and prescription is necessary, just like with birth control pills and the patch. It does not protect against STDs, including AIDS.   Estrogen plus progesterone injections are given every 28 to 30 days. They can be given in the upper arm, thigh, or buttocks. It does not protect against STDs, including AIDS.   Intrauterine device (IUD): copper T or progestin filled is a T-shaped  device that is put in a woman's uterus during a menstrual period to prevent pregnancy. The copper T IUD can last 10 years, and the progestin IUD can last 5 years. The progestin IUD can also help control heavy menstrual periods. It does not protect against STDs, including AIDS. The copper T IUD can be used as emergency contraception if inserted within 5 days of having unprotected intercourse.   Cervical cap is a round, soft latex or plastic cup that fits over the cervix and must be fitted by a caregiver. You do not need to use a spermicide with it or remove and insert it every time you have sexual intercourse. It does not protect against STDs, including AIDS.   Spermicides are chemicals that kill or block sperm from entering the cervix and uterus. They come in the form of creams, jellies, suppositories, foam, or tablets, and they do not require a prescription. They are inserted into the vagina with an applicator before having sexual intercourse. This must be repeated every time you have sexual intercourse.   Withdrawal is using the method of the female withdrawing his penis from sexual intercourse before he has a climax and deposits his sperm. It does not protect against STDs, including AIDS.   Female tubal ligation is when the woman's fallopian tubes are surgically sealed or tied to prevent the egg from traveling to the uterus. It does not protect against STDs, including AIDS.   Female sterilization is when the female has his tubes that carry sperm tied off (vasectomy) to stop sperm from entering the   vagina during sexual intercourse. It does not protect against STDs, including AIDS.  Regardless of which method of birth control you choose, it is still important that you use some form of protection against STDs. Document Released: 04/06/2005 Document Revised: 05/09/2010 Document Reviewed: 02/21/2009 [employer] Patient Information 2012 [employer]. Breastfeeding BENEFITS OF BREASTFEEDING For the baby  The  first milk (colostrum) helps the baby's digestive system function better.   There are antibodies from the mother in the milk that help the baby fight off infections.   The baby has a lower incidence of asthma, allergies, and SIDS (sudden infant death syndrome).   The nutrients in breast milk are better than formulas for the baby and helps the baby's brain grow better.   Babies who breastfeed have less gas, colic, and constipation.  For the mother  Breastfeeding helps develop a very special bond between mother and baby.   It is more convenient, always available at the correct temperature and cheaper than formula feeding.   It burns calories in the mother and helps with losing weight that was gained during pregnancy.   It makes the uterus contract back down to normal size faster and slows bleeding following delivery.   Breastfeeding mothers have a lower risk of developing breast cancer.  NURSE FREQUENTLY  A healthy, full-term baby may breastfeed as often as every hour or space his or her feedings to every 3 hours.   How often to nurse will vary from baby to baby. Watch your baby for signs of hunger, not the clock.   Nurse as often as the baby requests, or when you feel the need to reduce the fullness of your breasts.   Awaken the baby if it has been 3 to 4 hours since the last feeding.   Frequent feeding will help the mother make more milk and will prevent problems like sore nipples and engorgement of the breasts.  BABY'S POSITION AT THE BREAST  Whether lying down or sitting, be sure that the baby's tummy is facing your tummy.   Support the breast with 4 fingers underneath the breast and the thumb above. Make sure your fingers are well away from the nipple and baby's mouth.   Stroke the baby's lips and cheek closest to the breast gently with your finger or nipple.   When the baby's mouth is open wide enough, place all of your nipple and as much of the dark area around the nipple  as possible into your baby's mouth.   Pull the baby in close so the tip of the nose and the baby's cheeks touch the breast during the feeding.  FEEDINGS  The length of each feeding varies from baby to baby and from feeding to feeding.   The baby must suck about 2 to 3 minutes for your milk to get to him or her. This is called a "let down." For this reason, allow the baby to feed on each breast as long as he or she wants. Your baby will end the feeding when he or she has received the right balance of nutrients.   To break the suction, put your finger into the corner of the baby's mouth and slide it between his or her gums before removing your breast from his or her mouth. This will help prevent sore nipples.  REDUCING BREAST ENGORGEMENT  In the first week after your baby is born, you may experience signs of breast engorgement. When breasts are engorged, they feel heavy, warm, full, and may   be tender to the touch. You can reduce engorgement if you:   Nurse frequently, every 2 to 3 hours. Mothers who breastfeed early and often have fewer problems with engorgement.   Place light ice packs on your breasts between feedings. This reduces swelling. Wrap the ice packs in a lightweight towel to protect your skin.   Apply moist hot packs to your breast for 5 to 10 minutes before each feeding. This increases circulation and helps the milk flow.   Gently massage your breast before and during the feeding.   Make sure that the baby empties at least one breast at every feeding before switching sides.   Use a breast pump to empty the breasts if your baby is sleepy or not nursing well. You may also want to pump if you are returning to work or or you feel you are getting engorged.   Avoid bottle feeds, pacifiers or supplemental feedings of water or juice in place of breastfeeding.   Be sure the baby is latched on and positioned properly while breastfeeding.   Prevent fatigue, stress, and anemia.   Wear  a supportive bra, avoiding underwire styles.   Eat a balanced diet with enough fluids.  If you follow these suggestions, your engorgement should improve in 24 to 48 hours. If you are still experiencing difficulty, call your lactation consultant or caregiver. IS MY BABY GETTING ENOUGH MILK? Sometimes, mothers worry about whether their babies are getting enough milk. You can be assured that your baby is getting enough milk if:  The baby is actively sucking and you hear swallowing.   The baby nurses at least 8 to 12 times in a 24 hour time period. Nurse your baby until he or she unlatches or falls asleep at the first breast (at least 10 to 20 minutes), then offer the second side.   The baby is wetting 5 to 6 disposable diapers (6 to 8 cloth diapers) in a 24 hour period by 5 to 6 days of age.   The baby is having at least 2 to 3 stools every 24 hours for the first few months. Breast milk is all the food your baby needs. It is not necessary for your baby to have water or formula. In fact, to help your breasts make more milk, it is best not to give your baby supplemental feedings during the early weeks.   The stool should be soft and yellow.   The baby should gain 4 to 7 ounces per week after he is 4 days old.  TAKE CARE OF YOURSELF Take care of your breasts by:  Bathing or showering daily.   Avoiding the use of soaps on your nipples.   Start feedings on your left breast at one feeding and on your right breast at the next feeding.   You will notice an increase in your milk supply 2 to 5 days after delivery. You may feel some discomfort from engorgement, which makes your breasts very firm and often tender. Engorgement "peaks" out within 24 to 48 hours. In the meantime, apply warm moist towels to your breasts for 5 to 10 minutes before feeding. Gentle massage and expression of some milk before feeding will soften your breasts, making it easier for your baby to latch on. Wear a well fitting nursing  bra and air dry your nipples for 10 to 15 minutes after each feeding.   Only use cotton bra pads.   Only use pure lanolin on your nipples after nursing. You   do not need to wash it off before nursing.  Take care of yourself by:   Eating well-balanced meals and nutritious snacks.   Drinking milk, fruit juice, and water to satisfy your thirst (about 8 glasses a day).   Getting plenty of rest.   Increasing calcium in your diet (1200 mg a day).   Avoiding foods that you notice affect the baby in a bad way.  SEEK MEDICAL CARE IF:   You have any questions or difficulty with breastfeeding.   You need help.   You have a hard, red, sore area on your breast, accompanied by a fever of 100.5 F (38.1 C) or more.   Your baby is too sleepy to eat well or is having trouble sleeping.   Your baby is wetting less than 6 diapers per day, by 5 days of age.   Your baby's skin or white part of his or her eyes is more yellow than it was in the hospital.   You feel depressed.  Document Released: 04/06/2005 Document Revised: 12/17/2010 Document Reviewed: 11/19/2008 [employer] Patient Information 2012 [employer]. 

## 2011-04-20 NOTE — Progress Notes (Signed)
U/S scheduled 05/13/11 at 1030 am.

## 2011-04-21 NOTE — L&D Delivery Note (Signed)
Delivery Note At 6:44 PM a viable female was delivered via Vaginal, Spontaneous Delivery (Presentation: ; Occiput Anterior)Delivered by nurse.  APGAR: 8, 9; weight .   Placenta status: Intact, Spontaneous.  Cord:  with the following complications: None.   Anesthesia: 1% lidocaine Episiotomy: None Lacerations: 2nd degree Suture Repair: 2.0 vicryl Est. Blood Loss (mL): 450  Mom to postpartum.  Baby to nursery-stable.  LAWSON,MARIE [name] 05/31/2011, 7:01 PM

## 2011-05-04 ENCOUNTER — Other Ambulatory Visit: Payer: Self-pay | Admitting: Obstetrics and Gynecology

## 2011-05-04 ENCOUNTER — Ambulatory Visit (INDEPENDENT_AMBULATORY_CARE_PROVIDER_SITE_OTHER): Payer: Medicaid Other | Admitting: Advanced Practice Midwife

## 2011-05-04 DIAGNOSIS — O285 Abnormal chromosomal and genetic finding on antenatal screening of mother: Secondary | ICD-10-CM

## 2011-05-04 DIAGNOSIS — O99810 Abnormal glucose complicating pregnancy: Secondary | ICD-10-CM

## 2011-05-04 DIAGNOSIS — IMO0002 Reserved for concepts with insufficient information to code with codable children: Secondary | ICD-10-CM

## 2011-05-04 DIAGNOSIS — O289 Unspecified abnormal findings on antenatal screening of mother: Secondary | ICD-10-CM

## 2011-05-04 DIAGNOSIS — O24419 Gestational diabetes mellitus in pregnancy, unspecified control: Secondary | ICD-10-CM

## 2011-05-04 DIAGNOSIS — O0990 Supervision of high risk pregnancy, unspecified, unspecified trimester: Secondary | ICD-10-CM

## 2011-05-04 DIAGNOSIS — O09529 Supervision of elderly multigravida, unspecified trimester: Secondary | ICD-10-CM

## 2011-05-04 LAB — POCT URINALYSIS DIP (DEVICE)
Glucose, UA: NEGATIVE mg/dL
Specific Gravity, Urine: 1.025 (ref 1.005–1.030)
Urobilinogen, UA: 0.2 mg/dL (ref 0.0–1.0)

## 2011-05-04 NOTE — Progress Notes (Signed)
Active baby. FBS 79-90, 2 hour PC 83-152 (two out of range). Growth [place] scheduled 05/16/11. Discussed comfort measures for hip pain. GC/CT, GBS collected.

## 2011-05-04 NOTE — Patient Instructions (Signed)
Fetal Movement Counts Patient Name: __________________________________________________ Patient Due Date: ____________________ Kick counts is highly recommended in high risk pregnancies, but it is a good idea for every pregnant woman to do. Start counting fetal movements at 28 weeks of the pregnancy. Fetal movements increase after eating a full meal or eating or drinking something sweet (the blood sugar is higher). It is also important to drink plenty of fluids (well hydrated) before doing the count. Lie on your left side because it helps with the circulation or you can sit in a comfortable chair with your arms over your belly (abdomen) with no distractions around you. DOING THE COUNT  Try to do the count the same time of day each time you do it.   Mark the day and time, then see how long it takes for you to feel 10 movements (kicks, flutters, swishes, rolls). You should have at least 10 movements within 2 hours. You will most likely feel 10 movements in much less than 2 hours. If you do not, wait an hour and count again. After a couple of days you will see a pattern.   What you are looking for is a change in the pattern or not enough counts in 2 hours. Is it taking longer in time to reach 10 movements?  SEEK MEDICAL CARE IF:  You feel less than 10 counts in 2 hours. Tried twice.   No movement in one hour.   The pattern is changing or taking longer each day to reach 10 counts in 2 hours.   You feel the baby is not moving as it usually does.  Date: ____________ Movements: ____________ Start time: ____________ Finish time: ____________  Date: ____________ Movements: ____________ Start time: ____________ Finish time: ____________ Date: ____________ Movements: ____________ Start time: ____________ Finish time: ____________ Date: ____________ Movements: ____________ Start time: ____________ Finish time: ____________ Date: ____________ Movements: ____________ Start time: ____________ Finish time:  ____________ Date: ____________ Movements: ____________ Start time: ____________ Finish time: ____________ Date: ____________ Movements: ____________ Start time: ____________ Finish time: ____________ Date: ____________ Movements: ____________ Start time: ____________ Finish time: ____________  Date: ____________ Movements: ____________ Start time: ____________ Finish time: ____________ Date: ____________ Movements: ____________ Start time: ____________ Finish time: ____________ Date: ____________ Movements: ____________ Start time: ____________ Finish time: ____________ Date: ____________ Movements: ____________ Start time: ____________ Finish time: ____________ Date: ____________ Movements: ____________ Start time: ____________ Finish time: ____________ Date: ____________ Movements: ____________ Start time: ____________ Finish time: ____________ Date: ____________ Movements: ____________ Start time: ____________ Finish time: ____________  Date: ____________ Movements: ____________ Start time: ____________ Finish time: ____________ Date: ____________ Movements: ____________ Start time: ____________ Finish time: ____________ Date: ____________ Movements: ____________ Start time: ____________ Finish time: ____________ Date: ____________ Movements: ____________ Start time: ____________ Finish time: ____________ Date: ____________ Movements: ____________ Start time: ____________ Finish time: ____________ Date: ____________ Movements: ____________ Start time: ____________ Finish time: ____________ Date: ____________ Movements: ____________ Start time: ____________ Finish time: ____________  Date: ____________ Movements: ____________ Start time: ____________ Finish time: ____________ Date: ____________ Movements: ____________ Start time: ____________ Finish time: ____________ Date: ____________ Movements: ____________ Start time: ____________ Finish time: ____________ Date: ____________ Movements:  ____________ Start time: ____________ Finish time: ____________ Date: ____________ Movements: ____________ Start time: ____________ Finish time: ____________ Date: ____________ Movements: ____________ Start time: ____________ Finish time: ____________ Date: ____________ Movements: ____________ Start time: ____________ Finish time: ____________  Date: ____________ Movements: ____________ Start time: ____________ Finish time: ____________ Date: ____________ Movements: ____________ Start time: ____________ Finish time: ____________ Date: ____________ Movements: ____________ Start time:   ____________ Doreatha Martin time: ____________ Date: ____________ Movements: ____________ Start time: ____________ Doreatha Martin time: ____________ Date: ____________ Movements: ____________ Start time: ____________ Doreatha Martin time: ____________ Date: ____________ Movements: ____________ Start time: ____________ Doreatha Martin time: ____________ Date: ____________ Movements: ____________ Start time: ____________ Doreatha Martin time: ____________  Date: ____________ Movements: ____________ Start time: ____________ Doreatha Martin time: ____________ Date: ____________ Movements: ____________ Start time: ____________ Doreatha Martin time: ____________ Date: ____________ Movements: ____________ Start time: ____________ Doreatha Martin time: ____________ Date: ____________ Movements: ____________ Start time: ____________ Doreatha Martin time: ____________ Date: ____________ Movements: ____________ Start time: ____________ Doreatha Martin time: ____________ Date: ____________ Movements: ____________ Start time: ____________ Doreatha Martin time: ____________ Date: ____________ Movements: ____________ Start time: ____________ Doreatha Martin time: ____________  Date: ____________ Movements: ____________ Start time: ____________ Doreatha Martin time: ____________ Date: ____________ Movements: ____________ Start time: ____________ Doreatha Martin time: ____________ Date: ____________ Movements: ____________ Start time: ____________ Doreatha Martin  time: ____________ Date: ____________ Movements: ____________ Start time: ____________ Doreatha Martin time: ____________ Date: ____________ Movements: ____________ Start time: ____________ Doreatha Martin time: ____________ Date: ____________ Movements: ____________ Start time: ____________ Doreatha Martin time: ____________ Date: ____________ Movements: ____________ Start time: ____________ Doreatha Martin time: ____________  Date: ____________ Movements: ____________ Start time: ____________ Doreatha Martin time: ____________ Date: ____________ Movements: ____________ Start time: ____________ Doreatha Martin time: ____________ Date: ____________ Movements: ____________ Start time: ____________ Doreatha Martin time: ____________ Date: ____________ Movements: ____________ Start time: ____________ Doreatha Martin time: ____________ Date: ____________ Movements: ____________ Start time: ____________ Doreatha Martin time: ____________ Date: ____________ Movements: ____________ Start time: ____________ Doreatha Martin time: ____________ Document Released: 05/06/2006 Document Revised: 12/17/2010 Document Reviewed: 11/06/2008 [employer] Patient Information 2012 [employer].Pregnancy - Third Trimester The third trimester begins at the 28th week of pregnancy and ends at birth. It is important to follow your doctor's instructions. HOME CARE   Go to your doctor's visits.   Do not smoke.   Do not drink alcohol or use drugs.   Only take medicine as told by your doctor.   Take prenatal vitamins as told. The vitamin should contain 1 milligram of folic acid.   Exercise.   Eat healthy foods. Eat regular, well-balanced meals.   You can have sex (intercourse) if there are no other problems with the pregnancy.   Do not use hot tubs, steam rooms, or saunas.   Wear a seat belt while driving.   Avoid raw meat, uncooked cheese, and litter boxes and soil used by cats.   Rest with your legs raised (elevated).   Make a list of emergency phone numbers. Keep this list with you.   Arrange  for help when you come back home after delivering the baby.   Make a trial run to the hospital.   Take prenatal classes.   Prepare the baby's nursery.   Do not travel out of the city. If you absolutely have to, get permission from your doctor first.   Wear flat shoes. Do not wear high heels.  GET HELP RIGHT AWAY IF:   You have a temperature by mouth above 102 F (38.9 C), not controlled by medicine.   You have not felt the baby move for more than 1 hour. If you think the baby is not moving as much as normal, eat something with sugar in it or lie down on your left side for an hour. The baby should move at least 4 to 5 times per hour.   Fluid is coming from the vagina.   Blood is coming from the vagina. Light spotting is common, especially after sex (intercourse).   You have belly (abdominal) pain.   You have a bad  smelling fluid (discharge) coming from the vagina. The fluid changes from clear to white.   You still feel sick to your stomach (nauseous).   You throw up (vomit) for more than 24 hours.   You have the chills.   You have shortness of breath.   You have a burning feeling when you pee (urinate).   You lose or gain more than 2 pounds (0.9 kilograms) of weight over a week, or as told by your doctor.   Your face, hands, feet, or legs get puffy (swell).   You have a bad headache that will not go away.   You start to have problems seeing (blurry or double vision).   You fall, are in a car accident, or have any kind of trauma.   There is mental or physical violence at home.   You have any concerns or worries during your pregnancy.  MAKE SURE YOU:   Understand these instructions.   Will watch your condition.   Will get help right away if you are not doing well or get worse.  Document Released: 07/01/2009 Document Revised: 12/17/2010 Document Reviewed: 07/01/2009 [facility] Patient Information 2012 [place], [place].

## 2011-05-04 NOTE — Progress Notes (Signed)
P=95, c/o intermittent pain RLQ=5-6 when stands along time,Used Interpreter [employer]

## 2011-05-05 LAB — GC/CHLAMYDIA PROBE AMP, GENITAL: GC Probe Amp, Genital: NEGATIVE

## 2011-05-07 LAB — CULTURE, BETA STREP (GROUP B ONLY)

## 2011-05-11 ENCOUNTER — Ambulatory Visit (INDEPENDENT_AMBULATORY_CARE_PROVIDER_SITE_OTHER): Payer: Medicaid Other | Admitting: Family Medicine

## 2011-05-11 ENCOUNTER — Encounter: Payer: Self-pay | Admitting: Family Medicine

## 2011-05-11 DIAGNOSIS — O2390 Unspecified genitourinary tract infection in pregnancy, unspecified trimester: Secondary | ICD-10-CM

## 2011-05-11 DIAGNOSIS — O24419 Gestational diabetes mellitus in pregnancy, unspecified control: Secondary | ICD-10-CM

## 2011-05-11 DIAGNOSIS — O2340 Unspecified infection of urinary tract in pregnancy, unspecified trimester: Secondary | ICD-10-CM

## 2011-05-11 DIAGNOSIS — O99810 Abnormal glucose complicating pregnancy: Secondary | ICD-10-CM

## 2011-05-11 DIAGNOSIS — N390 Urinary tract infection, site not specified: Secondary | ICD-10-CM

## 2011-05-11 LAB — POCT URINALYSIS DIP (DEVICE)
Bilirubin Urine: NEGATIVE
Glucose, UA: NEGATIVE mg/dL
Ketones, ur: NEGATIVE mg/dL
pH: 7 (ref 5.0–8.0)

## 2011-05-11 MED ORDER — GLUCOSE BLOOD VI STRP: ORAL_STRIP | Status: DC

## 2011-05-11 NOTE — Progress Notes (Signed)
FBS 73-86 2 hr pp 101-131 U/S growth 05/16/2011

## 2011-05-11 NOTE — Patient Instructions (Signed)
Gestational Diabetes Mellitus Gestational diabetes mellitus (GDM) is diabetes that occurs only during pregnancy. This happens when the body cannot properly handle the glucose (sugar) that increases in the blood after eating. During pregnancy, insulin resistance (reduced sensitivity to insulin) occurs because of the release of hormones from the placenta. Usually, the pancreas of pregnant women produces enough insulin to overcome the resistance that occurs. However, in gestational diabetes, the insulin is there but it does not work effectively. If the resistance is severe enough that the pancreas does not produce enough insulin, extra glucose builds up in the blood.  WHO IS AT RISK FOR DEVELOPING GESTATIONAL DIABETES?  Women with a history of diabetes in the family.   Women over age 25.   Women who are overweight.   Women in certain ethnic groups (Hispanic, African American, Native American, Asian and Pacific Islander).  WHAT CAN HAPPEN TO THE BABY? If the mother's blood glucose is too high while she is pregnant, the extra sugar will travel through the umbilical cord to the baby. Some of the problems the baby may have are:  Large Baby - If the baby receives too much sugar, the baby will gain more weight. This may cause the baby to be too large to be born normally (vaginally) and a Cesarean section (C-section) may be needed.   Low Blood Glucose (hypoglycemia) - The baby makes extra insulin, in response to the extra sugar its gets from its mother. When the baby is born and no longer needs this extra insulin, the baby's blood glucose level may drop.   Jaundice (yellow coloring of the skin and eyes) - This is fairly common in babies. It is caused from a build-up of the chemical called bilirubin. This is rarely serious, but is seen more often in babies whose mothers had gestational diabetes.  RISKS TO THE MOTHER Women who have had gestational diabetes may be at higher risk for some problems,  including:  Preeclampsia or toxemia, which includes problems with high blood pressure. Blood pressure and protein levels in the urine must be checked frequently.   Infections.   Cesarean section (C-section) for delivery.   Developing Type 2 diabetes later in life. About 30-50% will develop diabetes later, especially if obese.  DIAGNOSIS  The hormones that cause insulin resistance are highest at about 24-28 weeks of pregnancy. If symptoms are experienced, they are much like symptoms you would normally expect during pregnancy.  GDM is often diagnosed using a two part method: 1. After 24-28 weeks of pregnancy, the woman drinks a glucose solution and takes a blood test. If the glucose level is high, a second test will be given.  2. Oral Glucose Tolerance Test (OGTT) which is 3 hours long - After not eating overnight, the blood glucose is checked. The woman drinks a glucose solution, and hourly blood glucose tests are taken.  If the woman has risk factors for GDM, the caregiver may test earlier than 24 weeks of pregnancy. TREATMENT  Treatment of GDM is directed at keeping the mother's blood glucose level normal, and may include:  Meal planning.   Taking insulin or other medicine to control your blood glucose level.   Exercise.   Keeping a daily record of the foods you eat.   Blood glucose monitoring and keeping a record of your blood glucose levels.   May monitor ketone levels in the urine, although this is no longer considered necessary in most pregnancies.  HOME CARE INSTRUCTIONS  While you are pregnant:    Follow your caregiver's advice regarding your prenatal appointments, meal planning, exercise, medicines, vitamins, blood and other tests, and physical activities.   Keep a record of your meals, blood glucose tests, and the amount of insulin you are taking (if any). Show this to your caregiver at every prenatal visit.   If you have GDM, you may have problems with hypoglycemia (low  blood glucose). You may suspect this if you become suddenly dizzy, feel shaky, and/or weak. If you think this is happening and you have a glucose meter, try to test your blood glucose level. Follow your caregiver's advice for when and how to treat your low blood glucose. Generally, the 15:15 rule is followed: Treat by consuming 15 grams of carbohydrates, wait 15 minutes, and recheck blood glucose. Examples of 15 grams of carbohydrates are:   1 cup skim or low-fat milk.    cup juice.   3-4 glucose tablets.   5-6 hard candies.   1 small box raisins.    cup regular soda pop.   Practice good hygiene, to avoid infections.   Do not smoke.  SEEK MEDICAL CARE IF:   You develop abnormal vaginal discharge, with or without itching.   You become weak and tired more than expected.   You seem to sweat a lot.   You have a sudden increase in weight, 5 pounds or more in one week.   You are losing weight, 3 pounds or more in a week.   Your blood glucose level is high, and you need instructions on what to do about it.  SEEK IMMEDIATE MEDICAL CARE IF:   You develop a severe headache.   You faint or pass out.   You develop nausea and vomiting.   You become disoriented or confused.   You have a convulsion.   You develop vision problems.   You develop stomach pain.   You develop vaginal bleeding.   You develop uterine contractions.   You have leaking or a gush of fluid from the vagina.  AFTER YOU HAVE THE BABY:  Go to all of your follow-up appointments, and have blood tests as advised by your caregiver.   Maintain a healthy lifestyle, to prevent diabetes in the future. This includes:   Following a healthy meal plan.   Controlling your weight.   Getting enough exercise and proper rest.   Do not smoke.   Breastfeed your baby if you can. This will lower the chance of you and your baby developing diabetes later in life.  For more information about diabetes, go to the American  Diabetes Association at: www.americandiabetesassociation.org. For more information about gestational diabetes, go to the American Congress of Obstetricians and Gynecologists at: www.acog.org. Document Released: 07/13/2000 Document Revised: 12/17/2010 Document Reviewed: 02/04/2009 [employer] Patient Information 2012 [employer]. Birth Control Choices Birth control is the use of any practices, methods, or devices to prevent pregnancy from happening in a sexually active woman.  Below are some birth control choices to help avoid pregnancy.  Not having sex (abstinence) is the surest form of birth control. This requires self-control. There is no risk of acquiring a sexually transmitted disease (STD), including acquired immunodeficiency syndrome (AIDS).   Periodic abstinence requires self-control during certain times of the month.   Calendar method, timing your menstrual periods from month to month.   Ovulation method is avoiding sexual intercourse around the time you produce an egg (ovulate).   Symptotherm method is avoiding sexual intercourse at the time of ovulation, using a thermometer   and ovulation symptoms.   Post ovulation method is the timing of sexual intercourse after you ovulated.  These methods do not protect against STDs, including AIDS.  Birth control pills (BCPs) contain estrogen and progesterone hormone. These medicines work by stopping the egg from forming in the ovary (ovulation). Birth control pills are prescribed by a caregiver who will ask you questions about the risks of taking BCPs. Birth control pills do not protect against STDs, including AIDS.   "Minipill" birth control pills have only the progesterone hormone. They are taken every day of each month and must be prescribed by your caregiver. They do not protect against STDs, including AIDS.   Emergency contraception is often call the "morning after" pill. This pill can be taken right after sex or up to five days after sex  if you think your birth control failed, you failed to use contraception, or you were forced to have sex. It is most effective the sooner you take the pills after having sexual intercourse. Do not use emergency contraception as your only form of birth control. Emergency contraceptive pills are available without a prescription. Check with your pharmacist.   Condoms are a thin sheath of latex, synthetic material, or lambskin worn over the penis during sexual intercourse. They can have a spermicide in or on them when you buy them. Latex condoms can prevent pregnancy and STDs. "Natural" or lambskin condoms can prevent pregnancy but may not protect against STDs, including AIDS.   Female condoms are a soft, loose-fitting sheath that is put into the vagina before sexual intercourse. They can prevent pregnancy and STDs, including AIDS.   Sponge is a soft, circular piece of polyurethane foam with spermicide in it that is inserted into the vagina after wetting it and before sexual intercourse. It does not require a prescription from your caregiver. It does not protect against STDs, including AIDS.   Diaphragm is a soft, latex, dome-shaped barrier that must be fitted by a caregiver. It is inserted into the vagina, along with a spermicidal jelly. After the proper fitting for a diaphragm, always insert the diaphragm before intercourse. The diaphragm should be left in the vagina for 6 to 8 hours after intercourse. Removal and reinsertion with a spermicide is always necessary after any use. It does not protect against STDs, including AIDS.   Progesterone-only injections are given every 3 months to prevent pregnancy. These injections contain synthetic progesterone and no estrogen. This hormone stops the ovaries from releasing eggs. It also causes the cervical mucus to thicken and changes the uterine lining. This makes it harder for sperm to survive in the uterus. It does not protect against STDs, including AIDS.   Birth  Control Patch contains hormones similar to those in birth control pills, so effectiveness, risks, and side effects are similar. It must be changed once a week and is prescribed by a caregiver. It is less effective in very overweight women. It does not protect against STDs, including AIDS.   Vaginal Ring contains hormones similar to those in birth control pills. It is left in place for 3 weeks, removed for 1 week, and then a new one is put back into the vagina. It comes with a timer to put in your purse to help you remember when to take it out or put a new one in. A caregiver's examination and prescription is necessary, just like with birth control pills and the patch. It does not protect against STDs, including AIDS.   Estrogen plus progesterone   injections are given every 28 to 30 days. They can be given in the upper arm, thigh, or buttocks. It does not protect against STDs, including AIDS.   Intrauterine device (IUD): copper T or progestin filled is a T-shaped device that is put in a woman's uterus during a menstrual period to prevent pregnancy. The copper T IUD can last 10 years, and the progestin IUD can last 5 years. The progestin IUD can also help control heavy menstrual periods. It does not protect against STDs, including AIDS. The copper T IUD can be used as emergency contraception if inserted within 5 days of having unprotected intercourse.   Cervical cap is a round, soft latex or plastic cup that fits over the cervix and must be fitted by a caregiver. You do not need to use a spermicide with it or remove and insert it every time you have sexual intercourse. It does not protect against STDs, including AIDS.   Spermicides are chemicals that kill or block sperm from entering the cervix and uterus. They come in the form of creams, jellies, suppositories, foam, or tablets, and they do not require a prescription. They are inserted into the vagina with an applicator before having sexual intercourse. This  must be repeated every time you have sexual intercourse.   Withdrawal is using the method of the female withdrawing his penis from sexual intercourse before he has a climax and deposits his sperm. It does not protect against STDs, including AIDS.   Female tubal ligation is when the woman's fallopian tubes are surgically sealed or tied to prevent the egg from traveling to the uterus. It does not protect against STDs, including AIDS.   Female sterilization is when the female has his tubes that carry sperm tied off (vasectomy) to stop sperm from entering the vagina during sexual intercourse. It does not protect against STDs, including AIDS.  Regardless of which method of birth control you choose, it is still important that you use some form of protection against STDs. Document Released: 04/06/2005 Document Revised: 05/09/2010 Document Reviewed: 02/21/2009 [employer] Patient Information 2012 [employer]. Breastfeeding BENEFITS OF BREASTFEEDING For the baby  The first milk (colostrum) helps the baby's digestive system function better.   There are antibodies from the mother in the milk that help the baby fight off infections.   The baby has a lower incidence of asthma, allergies, and SIDS (sudden infant death syndrome).   The nutrients in breast milk are better than formulas for the baby and helps the baby's brain grow better.   Babies who breastfeed have less gas, colic, and constipation.  For the mother  Breastfeeding helps develop a very special bond between mother and baby.   It is more convenient, always available at the correct temperature and cheaper than formula feeding.   It burns calories in the mother and helps with losing weight that was gained during pregnancy.   It makes the uterus contract back down to normal size faster and slows bleeding following delivery.   Breastfeeding mothers have a lower risk of developing breast cancer.  NURSE FREQUENTLY  A healthy, full-term baby  may breastfeed as often as every hour or space his or her feedings to every 3 hours.   How often to nurse will vary from baby to baby. Watch your baby for signs of hunger, not the clock.   Nurse as often as the baby requests, or when you feel the need to reduce the fullness of your breasts.   Awaken the   baby if it has been 3 to 4 hours since the last feeding.   Frequent feeding will help the mother make more milk and will prevent problems like sore nipples and engorgement of the breasts.  BABY'S POSITION AT THE BREAST  Whether lying down or sitting, be sure that the baby's tummy is facing your tummy.   Support the breast with 4 fingers underneath the breast and the thumb above. Make sure your fingers are well away from the nipple and baby's mouth.   Stroke the baby's lips and cheek closest to the breast gently with your finger or nipple.   When the baby's mouth is open wide enough, place all of your nipple and as much of the dark area around the nipple as possible into your baby's mouth.   Pull the baby in close so the tip of the nose and the baby's cheeks touch the breast during the feeding.  FEEDINGS  The length of each feeding varies from baby to baby and from feeding to feeding.   The baby must suck about 2 to 3 minutes for your milk to get to him or her. This is called a "let down." For this reason, allow the baby to feed on each breast as long as he or she wants. Your baby will end the feeding when he or she has received the right balance of nutrients.   To break the suction, put your finger into the corner of the baby's mouth and slide it between his or her gums before removing your breast from his or her mouth. This will help prevent sore nipples.  REDUCING BREAST ENGORGEMENT  In the first week after your baby is born, you may experience signs of breast engorgement. When breasts are engorged, they feel heavy, warm, full, and may be tender to the touch. You can reduce engorgement  if you:   Nurse frequently, every 2 to 3 hours. Mothers who breastfeed early and often have fewer problems with engorgement.   Place light ice packs on your breasts between feedings. This reduces swelling. Wrap the ice packs in a lightweight towel to protect your skin.   Apply moist hot packs to your breast for 5 to 10 minutes before each feeding. This increases circulation and helps the milk flow.   Gently massage your breast before and during the feeding.   Make sure that the baby empties at least one breast at every feeding before switching sides.   Use a breast pump to empty the breasts if your baby is sleepy or not nursing well. You may also want to pump if you are returning to work or or you feel you are getting engorged.   Avoid bottle feeds, pacifiers or supplemental feedings of water or juice in place of breastfeeding.   Be sure the baby is latched on and positioned properly while breastfeeding.   Prevent fatigue, stress, and anemia.   Wear a supportive bra, avoiding underwire styles.   Eat a balanced diet with enough fluids.  If you follow these suggestions, your engorgement should improve in 24 to 48 hours. If you are still experiencing difficulty, call your lactation consultant or caregiver. IS MY BABY GETTING ENOUGH MILK? Sometimes, mothers worry about whether their babies are getting enough milk. You can be assured that your baby is getting enough milk if:  The baby is actively sucking and you hear swallowing.   The baby nurses at least 8 to 12 times in a 24 hour time period. Nurse your   baby until he or she unlatches or falls asleep at the first breast (at least 10 to 20 minutes), then offer the second side.   The baby is wetting 5 to 6 disposable diapers (6 to 8 cloth diapers) in a 24 hour period by 5 to 6 days of age.   The baby is having at least 2 to 3 stools every 24 hours for the first few months. Breast milk is all the food your baby needs. It is not necessary  for your baby to have water or formula. In fact, to help your breasts make more milk, it is best not to give your baby supplemental feedings during the early weeks.   The stool should be soft and yellow.   The baby should gain 4 to 7 ounces per week after he is 4 days old.  TAKE CARE OF YOURSELF Take care of your breasts by:  Bathing or showering daily.   Avoiding the use of soaps on your nipples.   Start feedings on your left breast at one feeding and on your right breast at the next feeding.   You will notice an increase in your milk supply 2 to 5 days after delivery. You may feel some discomfort from engorgement, which makes your breasts very firm and often tender. Engorgement "peaks" out within 24 to 48 hours. In the meantime, apply warm moist towels to your breasts for 5 to 10 minutes before feeding. Gentle massage and expression of some milk before feeding will soften your breasts, making it easier for your baby to latch on. Wear a well fitting nursing bra and air dry your nipples for 10 to 15 minutes after each feeding.   Only use cotton bra pads.   Only use pure lanolin on your nipples after nursing. You do not need to wash it off before nursing.  Take care of yourself by:   Eating well-balanced meals and nutritious snacks.   Drinking milk, fruit juice, and water to satisfy your thirst (about 8 glasses a day).   Getting plenty of rest.   Increasing calcium in your diet (1200 mg a day).   Avoiding foods that you notice affect the baby in a bad way.  SEEK MEDICAL CARE IF:   You have any questions or difficulty with breastfeeding.   You need help.   You have a hard, red, sore area on your breast, accompanied by a fever of 100.5 F (38.1 C) or more.   Your baby is too sleepy to eat well or is having trouble sleeping.   Your baby is wetting less than 6 diapers per day, by 5 days of age.   Your baby's skin or white part of his or her eyes is more yellow than it was in the  hospital.   You feel depressed.  Document Released: 04/06/2005 Document Revised: 12/17/2010 Document Reviewed: 11/19/2008 [employer] Patient Information 2012 [employer]. 

## 2011-05-11 NOTE — Progress Notes (Signed)
Pelvic pressure. Mild swelling in feet.

## 2011-05-13 ENCOUNTER — Ambulatory Visit (HOSPITAL_COMMUNITY): Payer: Medicaid Other

## 2011-05-14 ENCOUNTER — Ambulatory Visit (HOSPITAL_COMMUNITY)
Admission: RE | Admit: 2011-05-14 | Discharge: 2011-05-14 | Disposition: A | Discharge status: Home / Self Care | Payer: Medicaid Other | Source: Ambulatory Visit | Attending: Family Medicine | Admitting: Family Medicine

## 2011-05-14 DIAGNOSIS — O3660X0 Maternal care for excessive fetal growth, unspecified trimester, not applicable or unspecified: Secondary | ICD-10-CM | POA: Insufficient documentation

## 2011-05-14 DIAGNOSIS — O09529 Supervision of elderly multigravida, unspecified trimester: Secondary | ICD-10-CM | POA: Insufficient documentation

## 2011-05-14 DIAGNOSIS — O24419 Gestational diabetes mellitus in pregnancy, unspecified control: Secondary | ICD-10-CM

## 2011-05-18 ENCOUNTER — Ambulatory Visit (INDEPENDENT_AMBULATORY_CARE_PROVIDER_SITE_OTHER): Payer: Medicaid Other | Admitting: Obstetrics and Gynecology

## 2011-05-18 DIAGNOSIS — O09529 Supervision of elderly multigravida, unspecified trimester: Secondary | ICD-10-CM

## 2011-05-18 DIAGNOSIS — IMO0002 Reserved for concepts with insufficient information to code with codable children: Secondary | ICD-10-CM

## 2011-05-18 DIAGNOSIS — O0990 Supervision of high risk pregnancy, unspecified, unspecified trimester: Secondary | ICD-10-CM

## 2011-05-18 DIAGNOSIS — O99810 Abnormal glucose complicating pregnancy: Secondary | ICD-10-CM

## 2011-05-18 DIAGNOSIS — O24419 Gestational diabetes mellitus in pregnancy, unspecified control: Secondary | ICD-10-CM

## 2011-05-18 DIAGNOSIS — O289 Unspecified abnormal findings on antenatal screening of mother: Secondary | ICD-10-CM

## 2011-05-18 DIAGNOSIS — O285 Abnormal chromosomal and genetic finding on antenatal screening of mother: Secondary | ICD-10-CM

## 2011-05-18 LAB — POCT URINALYSIS DIP (DEVICE)
Bilirubin Urine: NEGATIVE
Glucose, UA: NEGATIVE mg/dL
Ketones, ur: NEGATIVE mg/dL
Specific Gravity, Urine: 1.01 (ref 1.005–1.030)

## 2011-05-18 NOTE — Progress Notes (Signed)
cbg fasting 80-95 (1/7 abnormal value) 2hr pp 90-137 (1/7 abnormal). Patient doing well without complaints. FM/labor precautions reviewed

## 2011-05-25 ENCOUNTER — Ambulatory Visit (INDEPENDENT_AMBULATORY_CARE_PROVIDER_SITE_OTHER): Payer: Medicaid Other | Admitting: Obstetrics & Gynecology

## 2011-05-25 VITALS — BP 114/70 | Temp 97.0°F | Wt 183.2 lb

## 2011-05-25 DIAGNOSIS — O24419 Gestational diabetes mellitus in pregnancy, unspecified control: Secondary | ICD-10-CM

## 2011-05-25 DIAGNOSIS — O99810 Abnormal glucose complicating pregnancy: Secondary | ICD-10-CM

## 2011-05-25 NOTE — Patient Instructions (Signed)
Labor Induction A pregnant woman usually goes into labor spontaneously before the birth of her baby. Most babies are born between 37 and 42 weeks of the pregnancy. When this does not happen, caregivers may use medication or other methods to bring on (induce) labor. Labor induction causes a pregnant woman's uterus to contract, the cervix to open (dilate) and thin out (efface) to prepare for the vaginal birth of her baby. Several methods of labor induction may be used such as:  Massaging the nipple and areola of the breasts (nipple stimulation).   Prostaglandin medication used orally or as a vaginal cream.   Striping of membranes (your caregiver inserts a finger between the cervix and membranes around the baby's head) causes the body to produce prostaglandins that soften the cervix and cause the uterus to contract.   Rupture of the water bag (amniotomy).   Oxytocin by IV.   Special dilators placed into the cervical canal that causes the cervix to soften and open.   Mechanical devices to stretch open the cervix such as, a dilated foley catheter.  Whether your labor will be induced depends on the condition of you and your baby, how far along you are, are the baby's lung maturity, the condition of the cervix, the way the baby is lying, and other factors. Usually, labor is not induced before 39 weeks of the pregnancy unless there is a problem with the baby or mother, and it becomes necessary to induce labor. REASONS LABOR SHOULD BE INDUCED  The health of the baby or mother has become at risk.   The pregnancy is overdue by 2 weeks or more.   Your water breaks (premature rupture of membranes), the baby's lungs are mature, and labor does not start on its own.   You develop high blood pressure (toxemia of pregnancy).   You develop an infection in your uterus.   You have diabetes or other serious medical illness.   Amniotic fluid amounts are small around the baby.   Your placenta begins to  separate from the inner wall of the uterus before the baby is born (placental abruption). This condition may cause you to have an emergency Cesarean delivery.   You have fetal death.   A social induction is also known as an induction for convenience. Most of the time, labor is induced for sound medical reasons. Sometimes, it is done as a convenience. Living a long way from the hospital or having a history of very rapid labors may be reasons the mother may want to induce delivery.  REASONS LABOR SHOULD NOT BE INDUCED  You have had previous surgeries on your uterus. This is especially true if the surgeries went into the inside lining and cavity of the uterus. This gives an added risk for rupturing the uterus.   You have placenta previa. This means your placenta lies very low in the uterus and blocks the opening (cervix) for the baby to get out.   Your baby is not in a head down position. For example, if your baby lies across your uterus (transverse) instead of head first.   If the umbilical cord drops down into the birth canal in front of your baby. This could cut off the baby's blood supply and oxygen to the baby.  RISKS AND COMPLICATIONS Problems seldom occur with labor induction, but there can be some complications. Some of the risks of induction include:  Change in fetal heart rate (too high, too low or irradic).   Increased risk of   a premature baby, even if you think your baby is term.   Increased risk of fetal distress. This means your baby gets into problems during induction. This can be caused by the umbilical cord coming out in front of the baby or is being squeezed.   Increased risk of infection to mother and baby.   Increased chance of having a Cesarean delivery. This is an operation on your belly (abdomen) to remove the baby.   Strong contractions can lead to abruption. This is a separating of the placenta from the uterus.   Uterine rupture, especially if you had a previous  Cesarean or surgery on your uterus.  When labor is induced because of medical problems, other risks may be present. Induced labor may lead to:  Increased use of medications for pain relief.   Other interventions.  When induction is needed for medical reasons, the benefits of induction may outweigh the risks. PROCEDURE It can sometimes take up to 2 or 3 days to induce labor. It usually takes less time. It takes longer when you are induced early in the pregnancy and for first pregnancies.  Before coming to the hospital for an induction:  Do not eat much before you come to the hospital (for at least 8 hours).   Do not eat after midnight if you are going to be induced the next morning.   Be aware that medications for labor induction can upset your stomach.   Let your caregiver know if you need medications for pain.  HOME CARE INSTRUCTIONS If you have been induced in your caregiver's office to start labor, and are allowed to go home, follow the instructions given to you by your care giver. SEEK IMMEDIATE MEDICAL CARE IF:  You develop any kind of vaginal bleeding.   You develop contractions that are severe and continuous.   You feel faint or feel light headed.   You do not develop contractions within the time your caregiver suggests you should.   You begin to run a temperature of 100 F (37.8 C) or develop chills.   You no longer feel the normal fetal movement.  Document Released: 08/26/2006 Document Revised: 12/17/2010 Document Reviewed: 12/14/2008 Exit[employer] Patient Information 2012 [employer]. 

## 2011-05-25 NOTE — Progress Notes (Signed)
Scheduled for induction on 05/30/11 40 weeks. No contractions or leakage or bleeding. [place] 1/25 c/w 6 lb 10 oz  Nl CBG

## 2011-05-25 NOTE — Progress Notes (Signed)
Pain-lower abd. Pulse- 92  

## 2011-05-29 ENCOUNTER — Encounter (HOSPITAL_COMMUNITY): Payer: Self-pay | Admitting: *Deleted

## 2011-05-29 ENCOUNTER — Telehealth: Payer: Self-pay | Admitting: *Deleted

## 2011-05-29 ENCOUNTER — Telehealth (HOSPITAL_COMMUNITY): Payer: Self-pay | Admitting: *Deleted

## 2011-05-29 NOTE — Telephone Encounter (Signed)
Scheduled induction spoke with Heather. Induction scheduled for Sunday Feb 10 7:30 am. Called and advised pt of induction.

## 2011-05-29 NOTE — Telephone Encounter (Signed)
Preadmission screen Translator number [phone]

## 2011-05-31 ENCOUNTER — Inpatient Hospital Stay (HOSPITAL_COMMUNITY)
Admission: RE | Admit: 2011-05-31 | Discharge: 2011-06-02 | DRG: 775 | Disposition: A | Discharge status: Home / Self Care | Payer: Medicaid Other | Source: Ambulatory Visit | Attending: Obstetrics and Gynecology | Admitting: Obstetrics and Gynecology

## 2011-05-31 ENCOUNTER — Encounter (HOSPITAL_COMMUNITY): Payer: Self-pay

## 2011-05-31 DIAGNOSIS — O285 Abnormal chromosomal and genetic finding on antenatal screening of mother: Secondary | ICD-10-CM

## 2011-05-31 DIAGNOSIS — O99814 Abnormal glucose complicating childbirth: Secondary | ICD-10-CM

## 2011-05-31 DIAGNOSIS — O24419 Gestational diabetes mellitus in pregnancy, unspecified control: Secondary | ICD-10-CM

## 2011-05-31 DIAGNOSIS — O0990 Supervision of high risk pregnancy, unspecified, unspecified trimester: Secondary | ICD-10-CM

## 2011-05-31 DIAGNOSIS — O09529 Supervision of elderly multigravida, unspecified trimester: Secondary | ICD-10-CM

## 2011-05-31 LAB — CBC
Hemoglobin: 14.2 g/dL (ref 12.0–15.0)
MCH: 30.1 pg (ref 26.0–34.0)
MCV: 90.2 fL (ref 78.0–100.0)
RBC: 4.71 MIL/uL (ref 3.87–5.11)

## 2011-05-31 LAB — GLUCOSE, CAPILLARY: Glucose-Capillary: 69 mg/dL — ABNORMAL LOW (ref 70–99)

## 2011-05-31 MED ORDER — LACTATED RINGERS IV SOLN: INTRAVENOUS | Status: DC

## 2011-05-31 MED ORDER — OXYTOCIN 20 UNITS IN LACTATED RINGERS INFUSION - SIMPLE: 125.0000 mL/h | Freq: Once | INTRAVENOUS | Status: DC

## 2011-05-31 MED ORDER — OXYTOCIN 20 UNITS IN LACTATED RINGERS INFUSION - SIMPLE
1.0000 m[IU]/min | INTRAVENOUS | Status: DC
  Administered 2011-05-31: 2 m[IU]/min via INTRAVENOUS
  Filled 2011-05-31: qty 1000

## 2011-05-31 MED ORDER — ONDANSETRON HCL 4 MG/2ML IJ SOLN: 4.0000 mg | Freq: Four times a day (QID) | INTRAMUSCULAR | Status: DC | PRN

## 2011-05-31 MED ORDER — IBUPROFEN 600 MG PO TABS: 600.0000 mg | ORAL_TABLET | Freq: Four times a day (QID) | ORAL | Status: DC | PRN

## 2011-05-31 MED ORDER — CITRIC ACID-SODIUM CITRATE 334-500 MG/5ML PO SOLN: 30.0000 mL | ORAL | Status: DC | PRN

## 2011-05-31 MED ORDER — OXYTOCIN BOLUS FROM INFUSION
500.0000 mL | Freq: Once | INTRAVENOUS | Status: DC
Start: 2011-05-31 — End: 2011-05-31
  Filled 2011-05-31: qty 500

## 2011-05-31 MED ORDER — OXYCODONE-ACETAMINOPHEN 5-325 MG PO TABS: 1.0000 | ORAL_TABLET | ORAL | Status: DC | PRN

## 2011-05-31 MED ORDER — LIDOCAINE HCL (PF) 1 % IJ SOLN: 30.0000 mL | INTRAMUSCULAR | Status: DC | PRN

## 2011-05-31 MED ORDER — ONDANSETRON HCL 4 MG/2ML IJ SOLN: 4.0000 mg | INTRAMUSCULAR | Status: DC | PRN

## 2011-05-31 MED ORDER — SIMETHICONE 80 MG PO CHEW
80.0000 mg | CHEWABLE_TABLET | ORAL | Status: DC | PRN
  Administered 2011-06-01: 80 mg via ORAL

## 2011-05-31 MED ORDER — OXYTOCIN 20 UNITS IN LACTATED RINGERS INFUSION - SIMPLE: 1.0000 m[IU]/min | INTRAVENOUS | Status: DC

## 2011-05-31 MED ORDER — ACETAMINOPHEN 325 MG PO TABS: 650.0000 mg | ORAL_TABLET | ORAL | Status: DC | PRN

## 2011-05-31 MED ORDER — NALBUPHINE SYRINGE 5 MG/0.5 ML: 10.0000 mg | INJECTION | INTRAMUSCULAR | Status: DC | PRN

## 2011-05-31 MED ORDER — TERBUTALINE SULFATE 1 MG/ML IJ SOLN: 0.2500 mg | Freq: Once | INTRAMUSCULAR | Status: DC | PRN

## 2011-05-31 MED ORDER — BENZOCAINE-MENTHOL 20-0.5 % EX AERO: 1.0000 "application " | INHALATION_SPRAY | CUTANEOUS | Status: DC | PRN

## 2011-05-31 MED ORDER — TETANUS-DIPHTH-ACELL PERTUSSIS 5-2.5-18.5 LF-MCG/0.5 IM SUSP: 0.5000 mL | Freq: Once | INTRAMUSCULAR | Status: DC

## 2011-05-31 MED ORDER — FLEET ENEMA 7-19 GM/118ML RE ENEM: 1.0000 | ENEMA | RECTAL | Status: DC | PRN

## 2011-05-31 MED ORDER — SENNOSIDES-DOCUSATE SODIUM 8.6-50 MG PO TABS
2.0000 | ORAL_TABLET | Freq: Every day | ORAL | Status: DC
  Administered 2011-05-31 – 2011-06-01 (×2): 2 via ORAL

## 2011-05-31 MED ORDER — PRENATAL MULTIVITAMIN CH
1.0000 | ORAL_TABLET | Freq: Every day | ORAL | Status: DC
  Administered 2011-06-01: 1 via ORAL
  Filled 2011-05-31 (×3): qty 1

## 2011-05-31 MED ORDER — OXYTOCIN BOLUS FROM INFUSION: 500.0000 mL | Freq: Once | INTRAVENOUS | Status: DC

## 2011-05-31 MED ORDER — ZOLPIDEM TARTRATE 5 MG PO TABS: 5.0000 mg | ORAL_TABLET | Freq: Every evening | ORAL | Status: DC | PRN

## 2011-05-31 MED ORDER — PRENATAL MULTIVITAMIN CH: 1.0000 | ORAL_TABLET | Freq: Every day | ORAL | Status: DC

## 2011-05-31 MED ORDER — ONDANSETRON HCL 4 MG PO TABS: 4.0000 mg | ORAL_TABLET | ORAL | Status: DC | PRN

## 2011-05-31 MED ORDER — DIPHENHYDRAMINE HCL 25 MG PO CAPS: 25.0000 mg | ORAL_CAPSULE | Freq: Four times a day (QID) | ORAL | Status: DC | PRN

## 2011-05-31 MED ORDER — WITCH HAZEL-GLYCERIN EX PADS: 1.0000 "application " | MEDICATED_PAD | CUTANEOUS | Status: DC | PRN

## 2011-05-31 MED ORDER — IBUPROFEN 600 MG PO TABS
600.0000 mg | ORAL_TABLET | Freq: Four times a day (QID) | ORAL | Status: DC
  Administered 2011-06-01 (×4): 600 mg via ORAL
  Filled 2011-05-31 (×4): qty 1

## 2011-05-31 MED ORDER — OXYTOCIN BOLUS FROM INFUSION
500.0000 mL | Freq: Once | INTRAVENOUS | Status: DC
  Filled 2011-05-31: qty 500

## 2011-05-31 MED ORDER — NALBUPHINE SYRINGE 5 MG/0.5 ML
10.0000 mg | INJECTION | INTRAMUSCULAR | Status: DC | PRN
  Administered 2011-05-31: 10 mg via INTRAVENOUS
  Filled 2011-05-31 (×2): qty 0.5

## 2011-05-31 MED ORDER — IBUPROFEN 600 MG PO TABS
600.0000 mg | ORAL_TABLET | Freq: Four times a day (QID) | ORAL | Status: DC | PRN
  Administered 2011-05-31: 600 mg via ORAL
  Filled 2011-05-31: qty 1

## 2011-05-31 MED ORDER — PANTOPRAZOLE SODIUM 40 MG PO TBEC
40.0000 mg | DELAYED_RELEASE_TABLET | Freq: Every day | ORAL | Status: DC
  Administered 2011-06-01: 40 mg via ORAL
  Filled 2011-05-31 (×3): qty 1

## 2011-05-31 MED ORDER — LACTATED RINGERS IV SOLN: 500.0000 mL | INTRAVENOUS | Status: DC | PRN

## 2011-05-31 MED ORDER — DIBUCAINE 1 % RE OINT: 1.0000 "application " | TOPICAL_OINTMENT | RECTAL | Status: DC | PRN

## 2011-05-31 MED ORDER — LANOLIN HYDROUS EX OINT: TOPICAL_OINTMENT | CUTANEOUS | Status: DC | PRN

## 2011-05-31 MED ORDER — OXYCODONE-ACETAMINOPHEN 5-325 MG PO TABS
1.0000 | ORAL_TABLET | ORAL | Status: DC | PRN
  Administered 2011-06-01 – 2011-06-02 (×2): 1 via ORAL
  Filled 2011-05-31 (×2): qty 1

## 2011-05-31 MED ORDER — LIDOCAINE HCL (PF) 1 % IJ SOLN
30.0000 mL | INTRAMUSCULAR | Status: DC | PRN
  Administered 2011-05-31: 30 mL via SUBCUTANEOUS
  Filled 2011-05-31: qty 30

## 2011-05-31 MED ORDER — SODIUM CHLORIDE 0.9 % IV SOLN
2.0000 g | Freq: Once | INTRAVENOUS | Status: DC
  Filled 2011-05-31: qty 2000

## 2011-05-31 MED ORDER — OMEPRAZOLE MAGNESIUM 20 MG PO TBEC: 20.0000 mg | DELAYED_RELEASE_TABLET | Freq: Every day | ORAL | Status: DC

## 2011-05-31 MED ORDER — LACTATED RINGERS IV SOLN
INTRAVENOUS | Status: DC
  Administered 2011-05-31: 16:00:00 via INTRAVENOUS

## 2011-05-31 NOTE — H&P (Signed)
Attestation of Attending Supervision of Advanced Practitioner: Evaluation and management procedures were performed by the PA/NP/CNM/OB Fellow under my supervision/collaboration. Chart reviewed and agree with management and plan for IOL for Gest diabetes  Tilda Burrow 05/31/2011 6:06 PM

## 2011-05-31 NOTE — Progress Notes (Signed)
Jazyiah Yiu is a 41 y.o. [id number] at [redacted]w[redacted]d by ultrasound admitted for induction of labor due to Gestational diabetes.  Subjective:   Objective: BP 102/72  Pulse 83  Temp(Src) 98.3 F (36.8 C) (Oral)  Resp 16  Ht 5\' 2"  (1.575 m)  Wt 82.555 kg (182 lb)  BMI 33.29 kg/m2  LMP 08/23/2010  Breastfeeding? Unknown      FHT:  FHR: 140's bpm, variability: moderate,  accelerations:  Present,  decelerations:  Absent UC:   irregular, every 3 minutes SVE:   Dilation: 1.5 Effacement (%): 70 Station: -1 Exam by:: D Destiny Trickey CNM  Labs: Lab Results  Component Value Date   WBC 9.8 05/31/2011   HGB 14.2 05/31/2011   HCT 42.5 05/31/2011   MCV 90.2 05/31/2011   PLT 244 05/31/2011    Assessment / Plan: Induction of labor due to gestational diabetes,  progressing well on pitocin  Labor: Progressing on Pitocin, will continue to increase then AROM Preeclampsia:  no signs or symptoms of toxicity Fetal Wellbeing:  Category I Pain Control:  Labor support without medications I/D:  n/a Anticipated MOD:  NSVD  Zerita Boers 05/31/2011, 11:42 AM

## 2011-05-31 NOTE — Progress Notes (Signed)
Casey Hull is a 41 y.o. [id number] at [redacted]w[redacted]d by ultrasound admitted for induction of labor due to Gestational diabetes.  Subjective:   Objective: BP 106/65  Pulse 80  Temp(Src) 97.8 F (36.6 C) (Oral)  Resp 18  Ht 5\' 2"  (1.575 m)  Wt 82.555 kg (182 lb)  BMI 33.29 kg/m2  LMP 08/23/2010  Breastfeeding? Unknown      FHT:  145, mod variability, accels noted no decels UC:   regular, every 2-3 minutes SVE:   Dilation: 1.5 Effacement (%): 70 Station: -1 Exam by:: D Mervyn Pflaum CNM  Labs: Lab Results  Component Value Date   WBC 9.8 05/31/2011   HGB 14.2 05/31/2011   HCT 42.5 05/31/2011   MCV 90.2 05/31/2011   PLT 244 05/31/2011    Assessment / Plan: Induction of labor due to gestational diabetes,  progressing well on pitocin  Labor: Progressing on Pitocin, will continue to increase then AROM Preeclampsia:  no signs or symptoms of toxicity Fetal Wellbeing:  Category I Pain Control:  Labor support without medications I/D:  n/a Anticipated MOD:  NSVD  Casey Hull Casey Hull 05/31/2011, 3:17 PM

## 2011-05-31 NOTE — H&P (Signed)
Casey Hull is a 41 y.o. female presenting for induction of labor for gestational diabetes. Maternal Medical History:  Reason for admission: Admitted for induction of labor for gestational diabetes  Fetal activity: Perceived fetal activity is normal.   Last perceived fetal movement was within the past hour.      OB History    Grav Para Term Preterm Abortions TAB SAB Ect Mult Living   5 3 3  1  1   3      Past Medical History  Diagnosis Date  . Gestational diabetes 12/24/2010    diet controlled   History reviewed. No pertinent past surgical history. Family History: family history includes Asthma in her maternal aunt and mother. Social History:  reports that she has never smoked. She has never used smokeless tobacco. She reports that she does not drink alcohol or use illicit drugs.  Review of Systems  Constitutional: Negative.   HENT: Negative.   Eyes: Negative.   Respiratory: Negative.   Cardiovascular: Negative.   Gastrointestinal: Negative.   Genitourinary: Negative.   Musculoskeletal: Negative.   Skin: Negative.   Neurological: Negative.   Endo/Heme/Allergies: Negative.   Psychiatric/Behavioral: Negative.     Dilation: 1.5 Effacement (%): 70 Station: -1 Exam by:: D Ema Hebner CNM Blood pressure 108/69, pulse 83, temperature 98.3 F (36.8 C), temperature source Oral, resp. rate 16, height 5\' 2"  (1.575 m), weight 82.555 kg (182 lb), last menstrual period 08/23/2010, unknown if currently breastfeeding. Maternal Exam:  Abdomen: Patient reports no abdominal tenderness. Fetal presentation: vertex  Introitus: Normal vulva. Normal vagina.    Physical Exam  Constitutional: She is oriented to person, place, and time. She appears well-developed and well-nourished.  HENT:  Head: Normocephalic.  Neck: Normal range of motion.  Cardiovascular: Normal rate, regular rhythm, normal heart sounds and intact distal pulses.   Respiratory: Effort normal and breath sounds normal.  GI:  Soft. Bowel sounds are normal.  Genitourinary: Vagina normal and uterus normal.  Musculoskeletal: Normal range of motion.  Neurological: She is alert and oriented to person, place, and time. She has normal reflexes.  Skin: Skin is warm and dry.  Psychiatric: She has a normal mood and affect. Her behavior is normal. Judgment and thought content normal.    Prenatal labs: ABO, Rh: A/POS/-- (09/05 1036) Antibody: NEG (09/05 1036) Rubella: 14.6 (09/05 1036) RPR: NON REAC (10/31 1045)  HBsAg: NEGATIVE (09/05 1036)  HIV: NON REACTIVE (09/05 1036)  GBS:     Assessment/Plan: Stable maternal-fetal unit. For induction of labor for gestational diabetes.   Zerita Boers 05/31/2011, 9:08 AM

## 2011-06-01 MED ORDER — BENZOCAINE-MENTHOL 20-0.5 % EX AERO
INHALATION_SPRAY | CUTANEOUS | Status: AC
  Administered 2011-06-01: 02:00:00
  Filled 2011-06-01: qty 56

## 2011-06-01 NOTE — Progress Notes (Signed)
UR chart review completed.  

## 2011-06-01 NOTE — Progress Notes (Signed)
Post Partum Day 1 Subjective: no complaints, up ad lib, voiding and tolerating PO  Objective: Blood pressure 116/63, pulse 80, temperature 98.8 F (37.1 C), temperature source Oral, resp. rate 18, height 5\' 2"  (1.575 m), weight 82.555 kg (182 lb), last menstrual period 08/23/2010, unknown if currently breastfeeding.  Physical Exam:  General: alert, cooperative, appears stated age and no distress Lochia: appropriate Uterine Fundus: firm Incision: n/a DVT Evaluation: No evidence of DVT seen on physical exam. Negative Homan's sign. No cords or calf tenderness. No significant calf/ankle edema.   Basename 05/31/11 0815  HGB 14.2  HCT 42.5    Assessment/Plan: Plan for discharge tomorrow   LOS: 1 day   Pasqual Farias [name] 06/01/2011, 7:43 AM

## 2011-06-02 MED ORDER — IBUPROFEN 600 MG PO TABS: 600.0000 mg | ORAL_TABLET | Freq: Four times a day (QID) | ORAL | Status: AC

## 2011-06-02 NOTE — Discharge Summary (Signed)
Obstetric Discharge Summary Reason for Admission: induction of labor Prenatal Procedures: ultrasound Intrapartum Procedures: spontaneous vaginal delivery Postpartum Procedures: none Complications-Operative and Postpartum: 2nd degree perineal laceration Hemoglobin  Date Value Range Status  05/31/2011 14.2  12.0-15.0 (g/dL) Final     HCT  Date Value Range Status  05/31/2011 42.5  36.0-46.0 (%) Final    Discharge Diagnoses: Term Pregnancy-delivered  Discharge Information: Date: 06/02/2011 Activity: unrestricted Diet: routine Medications: Ibuprofen Condition: stable Instructions: refer to practice specific booklet Discharge to: home Follow-up Information    Follow up with [facility]. (make an appointment in 6 weeks.)    Contact information:   [address] [place] [address] [phone]         Newborn Data: Live born female  Birth Weight: 7 lb 3.3 oz (3270 g) APGAR: 8, 9  Home with mother.   D. Piloto Sherron Flemings Paz. MD PGY-1 06/02/2011, 9:58 AM

## 2011-06-02 NOTE — Discharge Instructions (Signed)
Postpartum Care After Vaginal Delivery  After you deliver your baby, you will stay in the hospital for 24 to 72 hours, unless there were problems with the labor or delivery, or you have medical problems. While you are in the hospital, you will receive help and instructions on how to care for yourself and your baby.  Your doctor will order pain medicine, in case you need it. You will have a small amount of bleeding from your vagina and should change your sanitary pad frequently. Wash your hands thoroughly with soap and water for at least 20 seconds after changing pads and using the toilet. Let the nurses know if you begin to pass blood clots or your bleeding increases. Do not flush blood clots down the toilet before having the nurse look at them, to make sure there is no placental tissue with them.  If you had an intravenous (IV), it will be removed within 24 hours, if there are no problems. The first time you get out of bed or take a shower, call the nurse to help you because you may get weak, lightheaded, or even faint. If you are breastfeeding, you may feel painful contractions of your uterus for a couple of weeks. This is normal. The contractions help your uterus get back to normal size. If you are not breastfeeding, wear a supportive bra and handle your breasts as little as possible until your milk has dried up. Hormones should not be given to dry up the breasts, because they can cause blood clots. You will be given your normal diet, unless you have diabetes or other medical problems.   The nurses may put an ice pack on your episiotomy (surgically enlarged opening), if you have one, to reduce the pain and swelling. On rare occasions, you may not be able to urinate and the nurse will need to empty your bladder with a catheter. If you had a postpartum tubal ligation ("tying tubes," female sterilization), it should not make your stay in the hospital longer.  You may have your baby in your room with you as much as  you like, unless you or the baby has a problem. Use the bassinet (basket) for the baby when going to and from the nursery. Do not carry the baby. Do not leave the postpartum area. If the mother is Rh negative (lacks a protein on the red blood cells) and the baby is Rh positive, the mother should get a Rho-gam shot to prevent Rh problems with future pregnancies.  You may be given written instructions for you and your baby, and necessary medicines, when you are discharged from the hospital. Be sure you understand and follow the instructions as advised.  HOME CARE INSTRUCTIONS   · Follow instructions and take the medicines given to you.   · Only take over-the-counter or prescription medicines for pain, discomfort, or fever as directed by your caregiver.   · Do not take aspirin, because it can cause bleeding.   · Increase your activities a little bit every day to build up your strength and endurance.   · Do not drink alcohol, especially if you are breastfeeding or taking pain medicine.   · Take your temperature twice a day and record it.   · You may have a small amount of bleeding or spotting for 2 to 4 weeks. This is normal.   · Do not use tampons or douche. Use sanitary pads.   · Try to have someone stay and help you for a   few days when you go home.   · Try to rest or take a nap when the baby is sleeping.   · If you are breastfeeding, wear a good support bra. If you are not breastfeeding, wear a supportive bra and do not stimulate your nipples.   · Eat a healthy, nutritious diet and continue to take your prenatal vitamins.   · Do not drive, do any heavy activities, or travel until your caregiver tells you it is okay.   · Do not have intercourse until your caregiver gives you permission to do so.   · Ask your caregiver when you can begin to exercise and what type of exercises to do.   · Call your caregiver if you think you are having a problem from your delivery.   · Call your pediatrician if you are having a problem  with the baby.   · Schedule your postpartum visit and keep it.   SEEK MEDICAL CARE IF:   · You have a temperature of 100° F (37.8° C) or higher.   · You have increased vaginal bleeding or are passing clots. Save any clots to show your caregiver.   · You have bloody urine or pain when you urinate.   · You have a bad smelling vaginal discharge.   · You have increasing pain or swelling on your episiotomy.   · You develop a severe headache.   · You feel depressed.   · The episiotomy is separating.   · You become dizzy or lightheaded.   · You develop a rash.   · You have a reaction or problems with your medicine.   · You have pain, redness, or swelling at the intravenous site.   SEEK IMMEDIATE MEDICAL CARE IF:   · You have chest pain.   · You develop shortness of breath.   · You pass out.   · You develop pain, with or without swelling or redness in your leg.   · You develop heavy vaginal bleeding, with or without blood clots.   · You develop stomach pain.   · You develop a bad smelling vaginal discharge.   MAKE SURE YOU:   · Understand these instructions.   · Will watch your condition.   · Will get help right away if you are not doing well or get worse.   Document Released: 02/01/2007 Document Revised: 12/17/2010 Document Reviewed: 02/13/2009  ExitCare® Patient Information ©2012 [employer].

## 2011-06-02 NOTE — Progress Notes (Signed)
Post Partum Day 2 Subjective: no complaints, up ad lib, voiding, tolerating PO and + flatus  Objective: Blood pressure 91/58, pulse 67, temperature 98.6 F (37 C), temperature source Oral, resp. rate 18, height 5\' 2"  (1.575 m), weight 82.555 kg (182 lb), last menstrual period 08/23/2010, unknown if currently breastfeeding.  Physical Exam:  General: alert, cooperative and no distress Lochia: appropriate Uterine Fundus: firm DVT Evaluation: No evidence of DVT seen on physical exam.   Basename 05/31/11 0815  HGB 14.2  HCT 42.5    Assessment/Plan: Discharge home, Breastfeeding and Contraception condom and no interested in circumcision.   LOS: 2 days    D. Piloto [employer]. MD PGY-1 06/02/2011, 9:52 AM

## 2011-06-05 NOTE — Discharge Summary (Signed)
Agree with above note.  Casey Hull 06/05/2011 6:50 AM   

## 2011-07-06 ENCOUNTER — Ambulatory Visit (INDEPENDENT_AMBULATORY_CARE_PROVIDER_SITE_OTHER): Payer: Medicaid Other | Admitting: Physician Assistant

## 2011-07-06 ENCOUNTER — Encounter: Payer: Self-pay | Admitting: Physician Assistant

## 2011-07-06 VITALS — BP 107/80 | HR 66 | Temp 97.0°F | Ht 62.0 in | Wt 162.1 lb

## 2011-07-06 DIAGNOSIS — O99815 Abnormal glucose complicating the puerperium: Secondary | ICD-10-CM

## 2011-07-06 NOTE — Progress Notes (Signed)
  Subjective:     Casey Hull is a 41 y.o. female who presents for a postpartum visit. She is 5 weeks postpartum following a spontaneous vaginal delivery. I have fully reviewed the prenatal and intrapartum course. The delivery was at 39 gestational weeks. Outcome: spontaneous vaginal delivery. Anesthesia: epidural. Postpartum course has been uncomplicated. Baby's course has been uncomplicated. Baby is feeding by breast. Bleeding no bleeding and stopped 5 days ago. Bowel function is normal. Bladder function is normal. Patient is not sexually active. Contraception method is condoms. Postpartum depression screening: negative.  The following portions of the patient's history were reviewed and updated as appropriate: allergies, current medications, past family history, past medical history, past social history, past surgical history and problem list.  Review of Systems A comprehensive review of systems was negative.   Objective:    BP 107/80  Pulse 66  Temp(Src) 97 F (36.1 C) (Oral)  Ht 5\' 2"  (1.575 m)  Wt 162 lb 1.6 oz (73.528 kg)  BMI 29.65 kg/m2  Breastfeeding? Yes  General:  alert, cooperative, appears stated age and no distress   Breasts:  inspection negative, no nipple discharge or bleeding, no masses or nodularity palpable        Assessment:    5 postpartum exam. Pap smear not done at today's visit.   Plan:    1. Contraception: condoms 2. Follow up in: 2 weeks for 2 hour GTT 3. Pap due 12/2013

## 2011-07-06 NOTE — Patient Instructions (Signed)
Contraception Choices Birth control (contraception) can stop pregnancy from happening. Different types of birth control work in different ways. Some can:  Make the mucus in the cervix thick. This makes it hard for sperm to get into the uterus.   Thin the lining of the uterus. This makes it hard for an egg to attach to the wall of the uterus.   Stop the ovaries from releasing an egg.   Block the sperm from reaching the egg.  Certain types of surgery can stop pregnancy from happening. For women, the sugery closes the fallopian tubes (tubal ligation). For men, the surgery stops sperm from releasing during sex (vasectomy). HORMONAL BIRTH CONTROL Hormonal birth control stops pregnancy by putting hormones into your body. Types of birth control include:  A small tube put under the skin of the upper arm (implant). The tube can stay in place for 3 years.   Shots given every 3 months.   Pills taken every day or once after sex (intercourse).   Patches that are changed once a week.   A ring put into the vagina (vaginal ring). The ring is left in place for 3 weeks and removed for 1 week. Then, a new ring is put in the vagina.  BARRIER BIRTH CONTROL  Barrier birth control blocks sperm from reaching the egg. Types of birth control include:   A thin covering worn on the penis (female condom) during sex.   A soft, loose covering put into the vagina (female condom) before sex.   A rubber bowl that sits over the cervix (diaphragm). The bowl must be made for you. The bowl is put into the vagina before sex. The bowl is left in place for 6 to 8 hours after sex.   A small, soft cup that fits over the cervix (cervical cap). The cup must be made for you. The cup can be left in place for 48 hours after sex.   A sponge that is put into the vagina before sex.   A chemical that kills or blocks sperm from getting into the cervix and uterus (spermicide). The chemical may be a cream, jelly, foam, or pill.    INTRAUTERINE (IUD) BIRTH CONTROL  IUD birth control is a small, T-shaped piece of plastic. The plastic is put inside the uterus. There are 2 types of IUD:  Copper IUD. The IUD is covered in copper wire. The copper makes a fluid that kills sperm. It can stay in place for 10 years.   Hormone IUD. The hormone stops pregnancy from happening. It can stay in place for 5 years.  NATURAL FAMILY PLANNING BIRTH CONTROL  Natural family planning means not having sex or using barrier birth control when the woman is fertile. A woman can:  Use a calendar to keep track of when she is fertile.   Use a thermometer to measure her body temperature.  Protect yourself against sexual diseases no matter what type of birth control you use. Talk to your doctor about which type of birth control is best for you. Document Released: 02/01/2009 Document Revised: 03/26/2011 Document Reviewed: 08/13/2010 ExitCare Patient Information 2012 [employer]. 

## 2011-07-19 ENCOUNTER — Other Ambulatory Visit: Payer: Self-pay | Admitting: Family Medicine

## 2011-07-20 ENCOUNTER — Other Ambulatory Visit: Payer: Self-pay

## 2011-07-21 LAB — GLUCOSE TOLERANCE, 2 HOURS W/ 1HR
Glucose, 1 hour: 133 mg/dL (ref 70–170)
Glucose, 2 hour: 113 mg/dL (ref 70–139)

## 2012-04-14 IMAGING — US US OB FOLLOW-UP
1 series · 12 of 28 positions shown · non-contrast
Comparison: none

[Series 1: us ob follow up · 12 of 32 slices shown]
[im 2/32]
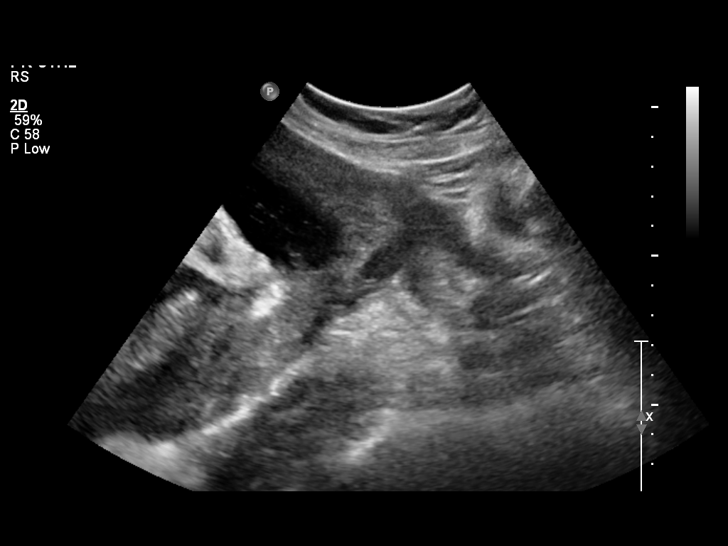
[im 4/32]
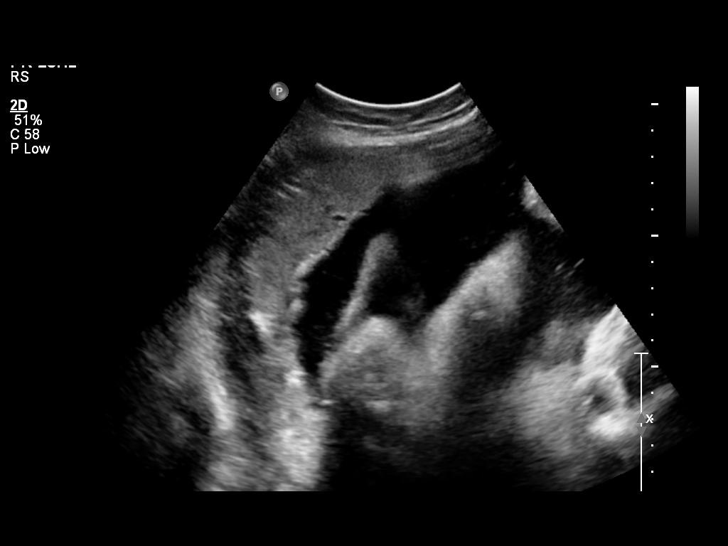
[im 6/32]
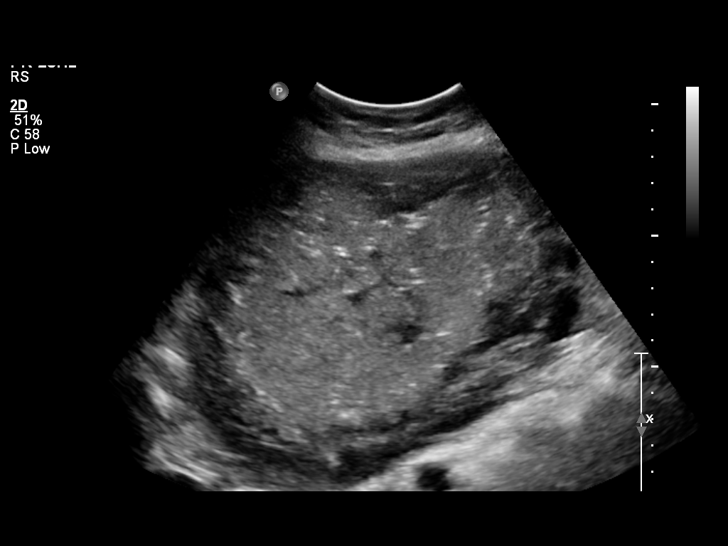
[im 10/32]
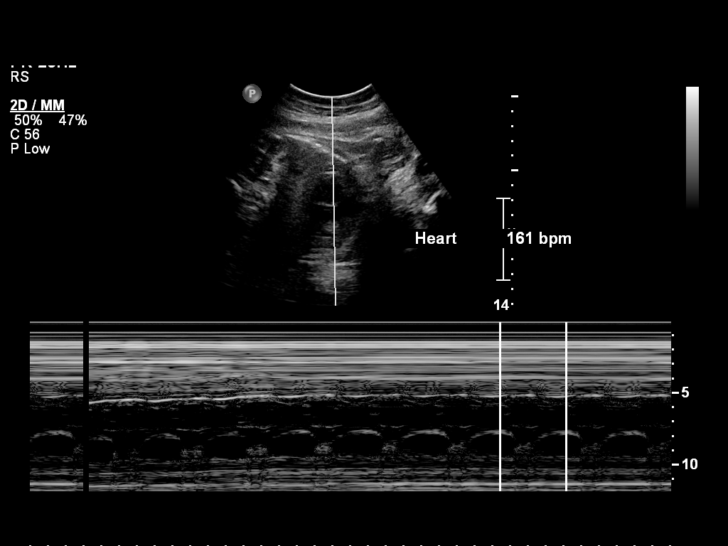
[im 12/32]
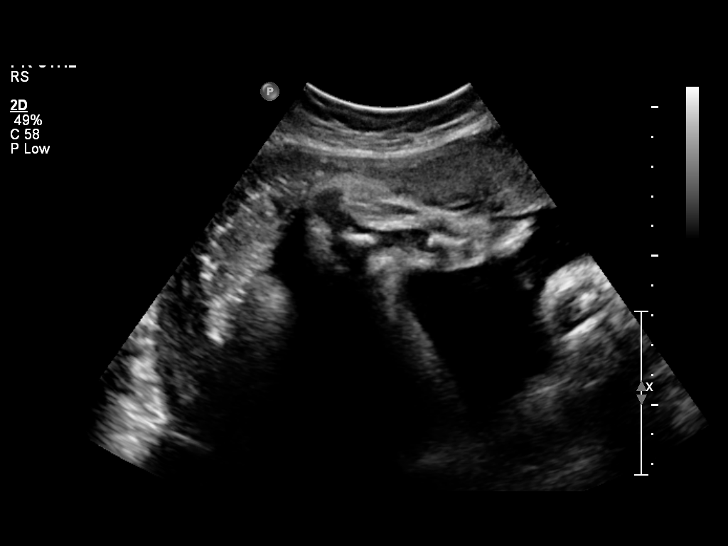
[im 14/32]
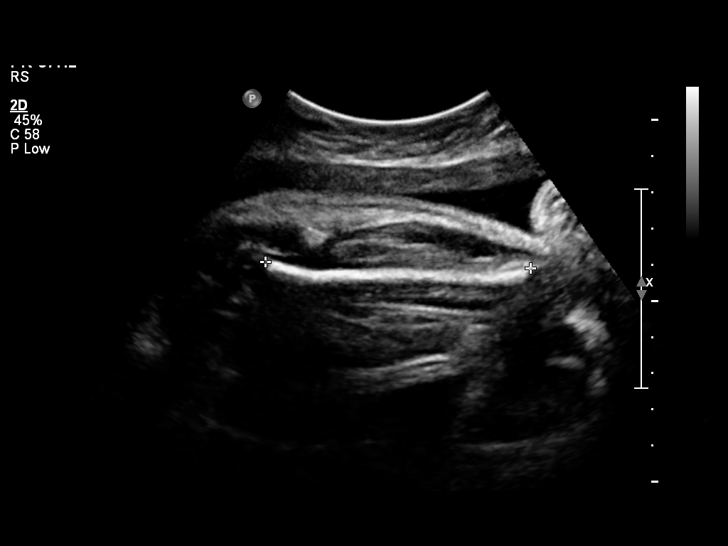
[im 18/32]
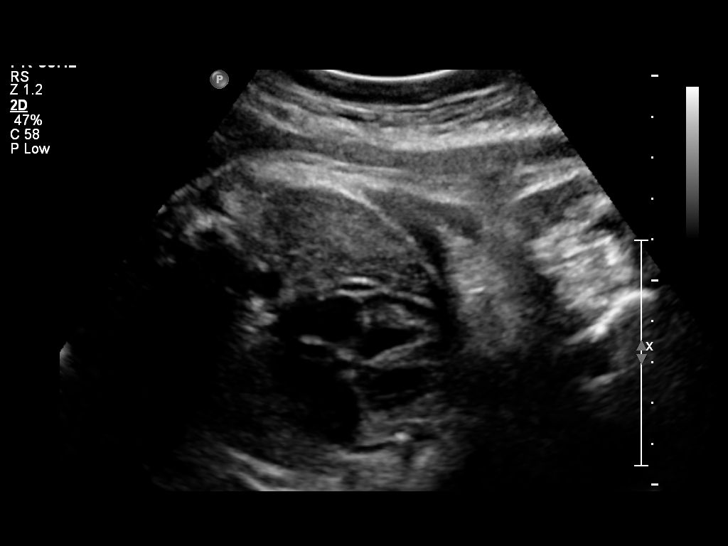
[im 20/32]
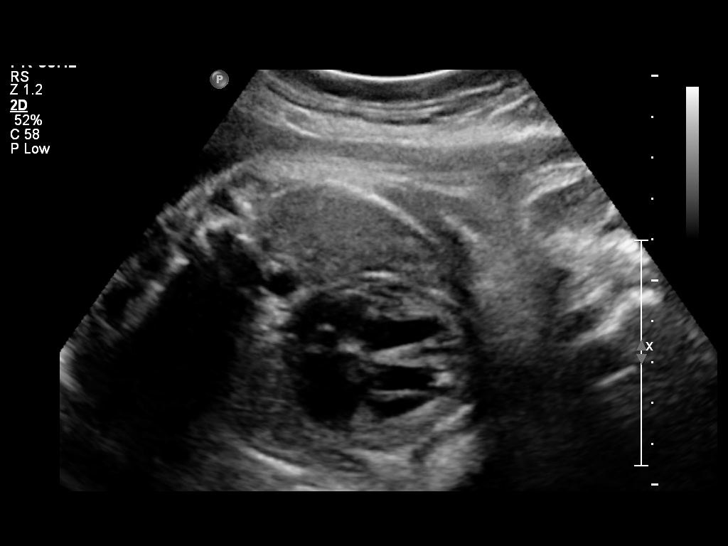
[im 22/32]
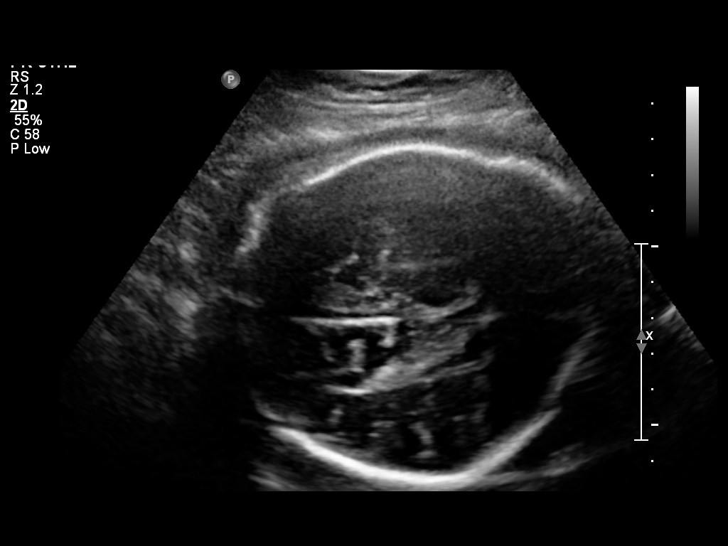
[im 26/32]
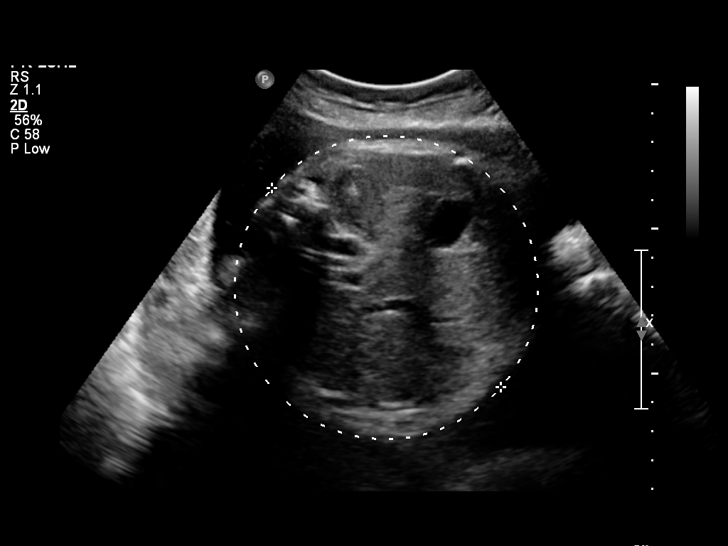
[im 28/32]
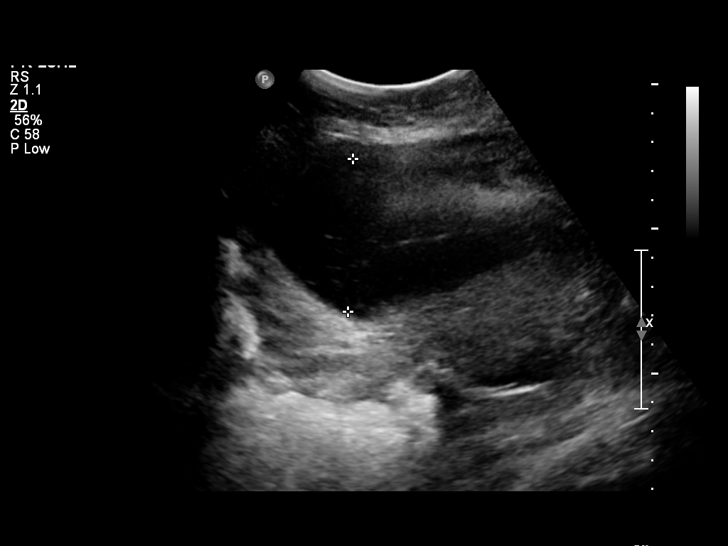
[im 30/32]
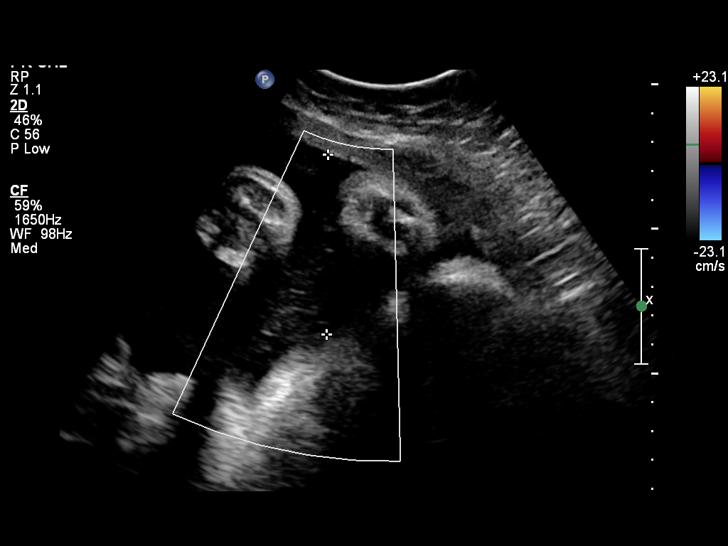

[12 of 28 positions shown; findings below may reference images not displayed]

OBSTETRICS REPORT
                      (Signed Final 05/14/2011 [DATE])

 Order#:         [id number]
Procedures

 US OB FOLLOW UP                                       76816.1
Indications

 Advanced maternal age (AMA), Multigravida
 Abnormal biochemical screen (trip) for Trisomy 21
 Assess Fetal Growth / Estimated Fetal Weight
 Size greater than dates (Large for gestational [AGE]
Fetal Evaluation

 Fetal Heart Rate:  161                         bpm
 Cardiac Activity:  Observed
 Presentation:      Cephalic
 Placenta:          Posterior, above cervical
                    os
 P. Cord            Previously Visualized
 Insertion:

 Amniotic Fluid
 AFI FV:      Subjectively upper-normal
 AFI Sum:     21.15   cm      83   %Tile     Larg Pckt:   6.21   cm
 RUQ:   5.28   cm    RLQ:    6.18   cm    LUQ:   6.21    cm   LLQ:    3.48   cm
Biometry

 BPD:       88  mm    G. Age:   35w 4d                CI:        74.42   70 - 86
                                                      FL/HC:      22.5   20.9 -

 HC:     323.8  mm    G. Age:   36w 5d       10  %    HC/AC:      0.99   0.92 -

 AC:     327.3  mm    G. Age:   36w 4d       36  %    FL/BPD:     83.0   71 - 87
 FL:        73  mm    G. Age:   37w 3d       43  %    FL/AC:      22.3   20 - 24

 Est. FW:    0776  gm    6 lb 10 oz      52  %
Gestational Age

 LMP:           37w 5d       Date:   08/23/10                 EDD:   05/30/11
 U/S Today:     36w 4d                                        EDD:   06/07/11
 Best:          37w 5d    Det. By:   LMP  (08/23/10)          EDD:   05/30/11
Anatomy

 Cranium:           Appears normal      Aortic Arch:       Previously seen
 Fetal Cavum:       Previously seen     Ductal Arch:       Previously seen
 Ventricles:        Appears normal      Diaphragm:         Previously seen
 Choroid Plexus:    Previously seen     Stomach:           Appears
                                                           normal, left
                                                           sided
 Cerebellum:        Previously seen     Abdomen:           Previously seen
 Posterior Fossa:   Previously seen     Abdominal Wall:    Previously seen
 Nuchal Fold:       Not applicable      Cord Vessels:      Previously seen
                    (>20 wks GA)
 Face:              Previously seen     Kidneys:           Appear normal
 Heart:             Appears normal      Bladder:           Appears normal
                    (4 chamber &
                    axis)
 RVOT:              Previously seen     Spine:             Previously seen
 LVOT:              Previously seen     Limbs:             Previously seen

 Other:     Male gender previously seen. Heels previously seen.
Cervix Uterus Adnexa

 Cervix:       Not visualized (advanced GA >34 wks)

 Adnexa:     No abnormality visualized.
Impression

 Single intrauterine gestation demonstrating an estimated
 gestational age by ultrasound of 36w 4d. This is correlated
 with expected estimated gestational age by LMP of 37w 5d.
 EFW is currently at the 52%.

 No late developing fetal anatomic abnormalities are noted
 associated with the lateral ventricles, four chamber heart,
 stomach, kidneys or bladder.

 Subjectively and quantitatively normal amniotic fluid volume.

 Thank you for sharing in the care of Ms. KARIE GUTI with
 questions or concerns.

## 2012-12-13 ENCOUNTER — Inpatient Hospital Stay (HOSPITAL_COMMUNITY)
Admission: AD | Admit: 2012-12-13 | Discharge: 2012-12-13 | Disposition: A | Discharge status: Home / Self Care | Payer: Self-pay | Source: Ambulatory Visit | Attending: Obstetrics & Gynecology | Admitting: Obstetrics & Gynecology

## 2012-12-13 ENCOUNTER — Encounter (HOSPITAL_COMMUNITY): Payer: Self-pay | Admitting: *Deleted

## 2012-12-13 DIAGNOSIS — Z3201 Encounter for pregnancy test, result positive: Secondary | ICD-10-CM | POA: Insufficient documentation

## 2012-12-13 LAB — URINALYSIS, ROUTINE W REFLEX MICROSCOPIC
Bilirubin Urine: NEGATIVE
Ketones, ur: NEGATIVE mg/dL
Nitrite: NEGATIVE
Protein, ur: NEGATIVE mg/dL
Urobilinogen, UA: 0.2 mg/dL (ref 0.0–1.0)

## 2012-12-13 LAB — URINE MICROSCOPIC-ADD ON

## 2012-12-13 NOTE — MAU Note (Signed)
PT SAYS SHE HAD LAST CYCLE IN July-  CYCLES ARE REG.  LAST SEX- Friday.   NO BIRTH CONTROL. DID HOME PREG TEST ON 8-16-  WAS POSITIVE.   Marland Kitchen  FOR OTHER PREG SHE RECEIVED PNC AT CLINIC.

## 2012-12-13 NOTE — MAU Note (Signed)
NOT IN LOBBY 

## 2012-12-14 LAB — URINE CULTURE
Colony Count: NO GROWTH
Culture: NO GROWTH

## 2012-12-15 ENCOUNTER — Inpatient Hospital Stay (HOSPITAL_COMMUNITY)
Admission: AD | Admit: 2012-12-15 | Discharge: 2012-12-15 | Disposition: A | Discharge status: Home / Self Care | Payer: Self-pay | Source: Ambulatory Visit | Attending: Obstetrics & Gynecology | Admitting: Obstetrics & Gynecology

## 2012-12-15 ENCOUNTER — Encounter (HOSPITAL_COMMUNITY): Payer: Self-pay | Admitting: *Deleted

## 2012-12-15 DIAGNOSIS — O99891 Other specified diseases and conditions complicating pregnancy: Secondary | ICD-10-CM | POA: Insufficient documentation

## 2012-12-15 DIAGNOSIS — O09529 Supervision of elderly multigravida, unspecified trimester: Secondary | ICD-10-CM | POA: Insufficient documentation

## 2012-12-15 DIAGNOSIS — K2970 Gastritis, unspecified, without bleeding: Secondary | ICD-10-CM | POA: Insufficient documentation

## 2012-12-15 DIAGNOSIS — R109 Unspecified abdominal pain: Secondary | ICD-10-CM | POA: Insufficient documentation

## 2012-12-15 LAB — URINALYSIS, ROUTINE W REFLEX MICROSCOPIC
Bilirubin Urine: NEGATIVE
Ketones, ur: NEGATIVE mg/dL
Specific Gravity, Urine: 1.02 (ref 1.005–1.030)
Urobilinogen, UA: 0.2 mg/dL (ref 0.0–1.0)

## 2012-12-15 LAB — COMPREHENSIVE METABOLIC PANEL
AST: 11 U/L (ref 0–37)
CO2: 24 mEq/L (ref 19–32)
Calcium: 9.1 mg/dL (ref 8.4–10.5)
Creatinine, Ser: 0.48 mg/dL — ABNORMAL LOW (ref 0.50–1.10)
GFR calc Af Amer: >90 mL/min (ref >90)
GFR calc non Af Amer: >90 mL/min (ref >90)
Glucose, Bld: 87 mg/dL (ref 70–99)
Total Protein: 6.6 g/dL (ref 6.0–8.3)

## 2012-12-15 LAB — URINE MICROSCOPIC-ADD ON

## 2012-12-15 LAB — CBC
MCH: 29 pg (ref 26.0–34.0)
MCHC: 34.3 g/dL (ref 30.0–36.0)
MCV: 84.3 fL (ref 78.0–100.0)
Platelets: 265 10*3/uL (ref 150–400)
RBC: 4.28 MIL/uL (ref 3.87–5.11)

## 2012-12-15 MED ORDER — FAMOTIDINE 20 MG PO TABS
20.0000 mg | ORAL_TABLET | Freq: Once | ORAL | Status: AC
  Administered 2012-12-15: 20 mg via ORAL
  Filled 2012-12-15: qty 1

## 2012-12-15 MED ORDER — FAMOTIDINE 20 MG PO TABS: 20.0000 mg | ORAL_TABLET | Freq: Two times a day (BID) | ORAL | Status: DC

## 2012-12-15 NOTE — MAU Note (Signed)
Patient state she has been having upper abdominal pain for a few days. Denies bleeding, discharge or vomiting.

## 2012-12-15 NOTE — MAU Provider Note (Signed)
Attestation of Attending Supervision of Advanced Practitioner (PA/CNM/NP): Evaluation and management procedures were performed by the Advanced Practitioner under my supervision and collaboration.  I have reviewed the Advanced Practitioner's note and chart, and I agree with the management and plan.  Finnley Larusso, MD, FACOG Attending Obstetrician & Gynecologist Faculty Practice, [facility]  

## 2012-12-15 NOTE — MAU Provider Note (Signed)
History     CSN: [id number]  Arrival date and time: 12/15/12 1229   First Provider Initiated Contact with Patient 12/15/12 [phone]      Chief Complaint  Patient presents with  . Abdominal Pain   HPI Ms. Casey Hull is a 42 y.o. [id number] at [redacted]w[redacted]d who presents to MAU today with complaint of upper abdominal pain x 3 days. The patient rates her pain at 7/10 now. She denies N/V/D/ or constipation, fever, vaginal bleeding or discharge.    OB History   Grav Para Term Preterm Abortions TAB SAB Ect Mult Living   6 4 4  1  1   4       Past Medical History  Diagnosis Date  . Gestational diabetes 12/24/2010    diet controlled    History reviewed. No pertinent past surgical history.  Family History  Problem Relation Age of Onset  . Asthma Mother   . Asthma Maternal Aunt     History  Substance Use Topics  . Smoking status: Never Smoker   . Smokeless tobacco: Never Used  . Alcohol Use: No    Allergies: No Known Allergies  No prescriptions prior to admission    Review of Systems  Constitutional: Negative for fever and malaise/fatigue.  Gastrointestinal: Positive for abdominal pain. Negative for nausea, vomiting, diarrhea and constipation.  Genitourinary: Negative for dysuria, urgency and frequency.       Neg - vaginal bleeding, discharge  Neurological: Negative for dizziness and loss of consciousness.   Physical Exam   Blood pressure 105/88, pulse 100, temperature 98.3 F (36.8 C), temperature source Oral, resp. rate 16, height 5' 1.5" (1.562 m), weight 178 lb 9.6 oz (81.012 kg), last menstrual period 10/27/2012, SpO2 100.00%.  Physical Exam  Constitutional: She is oriented to person, place, and time. She appears well-developed and well-nourished. No distress.  HENT:  Head: Normocephalic and atraumatic.  Cardiovascular: Normal rate, regular rhythm and normal heart sounds.   Respiratory: Effort normal and breath sounds normal. No respiratory distress.  GI: Soft. Bowel  sounds are normal. She exhibits no distension and no mass. There is tenderness (mild tenderness to palpation of the mid abdomen above the umbilicus bilaterally). There is no rebound and no guarding.  Neurological: She is alert and oriented to person, place, and time.  Skin: Skin is warm and dry. No erythema.  Psychiatric: She has a normal mood and affect.   Results for orders placed during the hospital encounter of 12/15/12 (from the past 24 hour(s))  URINALYSIS, ROUTINE W REFLEX MICROSCOPIC     Status: Abnormal   Collection Time    12/15/12 12:50 PM      Result Value Range   Color, Urine YELLOW  YELLOW   APPearance CLEAR  CLEAR   Specific Gravity, Urine 1.020  1.005 - 1.030   pH 7.0  5.0 - 8.0   Glucose, UA NEGATIVE  NEGATIVE mg/dL   Hgb urine dipstick TRACE (*) NEGATIVE   Bilirubin Urine NEGATIVE  NEGATIVE   Ketones, ur NEGATIVE  NEGATIVE mg/dL   Protein, ur NEGATIVE  NEGATIVE mg/dL   Urobilinogen, UA 0.2  0.0 - 1.0 mg/dL   Nitrite NEGATIVE  NEGATIVE   Leukocytes, UA SMALL (*) NEGATIVE  URINE MICROSCOPIC-ADD ON     Status: Abnormal   Collection Time    12/15/12 12:50 PM      Result Value Range   Squamous Epithelial / LPF MANY (*) RARE   WBC, UA 11-20  <3 WBC/hpf  RBC / HPF 3-6  <3 RBC/hpf   Bacteria, UA MANY (*) RARE   Urine-Other MUCOUS PRESENT    CBC     Status: None   Collection Time    12/15/12  1:55 PM      Result Value Range   WBC 10.2  4.0 - 10.5 K/uL   RBC 4.28  3.87 - 5.11 MIL/uL   Hemoglobin 12.4  12.0 - 15.0 g/dL   HCT 16.1  09.6 - 04.5 %   MCV 84.3  78.0 - 100.0 fL   MCH 29.0  26.0 - 34.0 pg   MCHC 34.3  30.0 - 36.0 g/dL   RDW 40.9  81.1 - 91.4 %   Platelets 265  150 - 400 K/uL  COMPREHENSIVE METABOLIC PANEL     Status: Abnormal   Collection Time    12/15/12  1:55 PM      Result Value Range   Sodium 135  135 - 145 mEq/L   Potassium 3.8  3.5 - 5.1 mEq/L   Chloride 101  96 - 112 mEq/L   CO2 24  19 - 32 mEq/L   Glucose, Bld 87  70 - 99 mg/dL   BUN 8  6  - 23 mg/dL   Creatinine, Ser 7.82 (*) 0.50 - 1.10 mg/dL   Calcium 9.1  8.4 - 95.6 mg/dL   Total Protein 6.6  6.0 - 8.3 g/dL   Albumin 3.3 (*) 3.5 - 5.2 g/dL   AST 11  0 - 37 U/L   ALT 11  0 - 35 U/L   Alkaline Phosphatase 65  39 - 117 U/L   Total Bilirubin 0.2 (*) 0.3 - 1.2 mg/dL   GFR calc non Af Amer >90  >90 mL/min   GFR calc Af Amer >90  >90 mL/min  AMYLASE     Status: None   Collection Time    12/15/12  1:55 PM      Result Value Range   Amylase 77  0 - 105 U/L  LIPASE, BLOOD     Status: None   Collection Time    12/15/12  1:55 PM      Result Value Range   Lipase 22  11 - 59 U/L     MAU Course  Procedures None  MDM +UPT UA, CBC, CMP, Amylase and lipase Patient given PO Pepcid in MAU - patient reports significant improvement in pain and is now rated at 2/10 All labs within normal limits. Patient's pain is limited to the upper abdomen.   Assessment and Plan  A: Abdominal pain in pregnancy 2/2 gastritis  P: Discharge home Rx for Pepcid sent to patient's pharmacy Patient advised to start prenatal care with provider of choice Patient may return to MAU as needed or if her condition were to change or worsen  Freddi Starr, PA-C  12/15/2012, 3:57 PM

## 2012-12-16 LAB — URINE CULTURE

## 2013-01-09 ENCOUNTER — Inpatient Hospital Stay (HOSPITAL_COMMUNITY): Payer: Medicaid Other

## 2013-01-09 ENCOUNTER — Encounter (HOSPITAL_COMMUNITY): Payer: Self-pay | Admitting: *Deleted

## 2013-01-09 ENCOUNTER — Inpatient Hospital Stay (HOSPITAL_COMMUNITY)
Admission: AD | Admit: 2013-01-09 | Discharge: 2013-01-09 | Disposition: A | Discharge status: Home / Self Care | Payer: Medicaid Other | Source: Ambulatory Visit | Attending: Obstetrics and Gynecology | Admitting: Obstetrics and Gynecology

## 2013-01-09 DIAGNOSIS — R109 Unspecified abdominal pain: Secondary | ICD-10-CM

## 2013-01-09 DIAGNOSIS — O09521 Supervision of elderly multigravida, first trimester: Secondary | ICD-10-CM

## 2013-01-09 DIAGNOSIS — O209 Hemorrhage in early pregnancy, unspecified: Secondary | ICD-10-CM

## 2013-01-09 DIAGNOSIS — O200 Threatened abortion: Secondary | ICD-10-CM

## 2013-01-09 LAB — HCG, QUANTITATIVE, PREGNANCY: hCG, Beta Chain, Quant, S: 6884 m[IU]/mL — ABNORMAL HIGH (ref <5)

## 2013-01-09 LAB — CBC
Hemoglobin: 12.9 g/dL (ref 12.0–15.0)
MCHC: 34 g/dL (ref 30.0–36.0)

## 2013-01-09 LAB — WET PREP, GENITAL: Trich, Wet Prep: NONE SEEN

## 2013-01-09 NOTE — MAU Provider Note (Signed)
History     CSN: [id number]  Arrival date and time: 01/09/13 1246   First Provider Initiated Contact with Patient 01/09/13 1334      Chief Complaint  Patient presents with  . Vaginal Bleeding   HPI Ms. Casey Hull is a 42 y.o. [id number] at [redacted]w[redacted]d who presents to MAU today with complaint of vaginal bleeding. The patient states that she has noted pink-red when wiping x 3 days. She denies bleeding today, lower abdominal pain, discharge, LOF or UTI symptoms.   OB History   Grav Para Term Preterm Abortions TAB SAB Ect Mult Living   6 4 4  1  1   4       Past Medical History  Diagnosis Date  . Gestational diabetes 12/24/2010    diet controlled    History reviewed. No pertinent past surgical history.  Family History  Problem Relation Age of Onset  . Asthma Mother   . Asthma Maternal Aunt     History  Substance Use Topics  . Smoking status: Never Smoker   . Smokeless tobacco: Never Used  . Alcohol Use: No    Allergies: No Known Allergies  Prescriptions prior to admission  Medication Sig Dispense Refill  . Prenatal Vit-Fe Fumarate-FA (PRENATAL MULTIVITAMIN) TABS tablet Take 1 tablet by mouth daily at 12 noon.        Review of Systems  Constitutional: Negative for fever and malaise/fatigue.  Gastrointestinal: Negative for nausea, vomiting, abdominal pain, diarrhea and constipation.  Genitourinary: Negative for dysuria, urgency and frequency.       + vaginal bleeding Neg - vaginal discharge, LOF   Physical Exam   Blood pressure 115/60, pulse 89, temperature 98.7 F (37.1 C), temperature source Oral, resp. rate 16, height 5\' 1"  (1.549 m), weight 179 lb 9.6 oz (81.466 kg), last menstrual period 10/27/2012, SpO2 100.00%.  Physical Exam  Constitutional: She is oriented to person, place, and time. She appears well-developed and well-nourished. No distress.  HENT:  Head: Normocephalic and atraumatic.  Cardiovascular: Normal rate, regular rhythm and normal heart sounds.    Respiratory: Effort normal and breath sounds normal. No respiratory distress.  GI: Soft. Bowel sounds are normal. She exhibits no distension and no mass. There is no tenderness. There is no rebound and no guarding.  Genitourinary: Uterus is enlarged (exam limited by maternal body habitus). Uterus is not tender. Cervix exhibits friability. Cervix exhibits no motion tenderness and no discharge. Right adnexum displays no mass and no tenderness. Left adnexum displays no mass and no tenderness. There is bleeding (scant bleeding when cervix was touched with the probe) around the vagina. Vaginal discharge (small amount of thin, mucus discharge noted) found.  Neurological: She is alert and oriented to person, place, and time.  Skin: Skin is warm and dry. No erythema.  Psychiatric: She has a normal mood and affect.   Results for orders placed during the hospital encounter of 01/09/13 (from the past 24 hour(s))  WET PREP, GENITAL     Status: Abnormal   Collection Time    01/09/13  1:46 PM      Result Value Range   Yeast Wet Prep HPF POC NONE SEEN  NONE SEEN   Trich, Wet Prep NONE SEEN  NONE SEEN   Clue Cells Wet Prep HPF POC NONE SEEN  NONE SEEN   WBC, Wet Prep HPF POC FEW (*) NONE SEEN  CBC     Status: None   Collection Time    01/09/13  2:03 PM  Result Value Range   WBC 10.2  4.0 - 10.5 K/uL   RBC 4.46  3.87 - 5.11 MIL/uL   Hemoglobin 12.9  12.0 - 15.0 g/dL   HCT 91.4  78.2 - 95.6 %   MCV 85.0  78.0 - 100.0 fL   MCH 28.9  26.0 - 34.0 pg   MCHC 34.0  30.0 - 36.0 g/dL   RDW 21.3  08.6 - 57.8 %   Platelets 268  150 - 400 K/uL  HCG, QUANTITATIVE, PREGNANCY     Status: Abnormal   Collection Time    01/09/13  2:03 PM      Result Value Range   hCG, Beta Chain, Quant, S 6884 (*) <5 mIU/mL   US Ob Comp Less 14 Wks  01/09/2013   CLINICAL DATA:  Gravida 6, para 4. By LMP, patient is 10 weeks 4 days. Scan performed for dating. Patient is having spotting. AMA.  EXAM: OBSTETRIC <14 WK ULTRASOUND   TRANSVAGINAL ULTRASOUND  TECHNIQUE: Transabdominal and transvaginal ultrasound were performed for evaluation of the gestation as well as the maternal uterus and adnexal regions.  COMPARISON:  None.  FINDINGS: Intrauterine gestational sac: Visualized/normal in shape.  Yolk sac:  Present and prominent, 7.8 mm  Embryo:  Not seen  Cardiac Activity: Not seen  MSD: 14.4  mm   6 w   2  d  Maternal uterus/adnexae: Right corpus luteum cyst is seen. Left ovary has a normal appearance.  IMPRESSION: 1. Intrauterine gestational sac with yolk sac present. Embryo is not identified at this time. 2. Followup ultrasound is suggested in 10-14 days to determine presence of fetal pole and for dating purposes.   Electronically Signed   By: Rosalie Gums M.D.   On: 01/09/2013 14:54   US Ob Transvaginal  01/09/2013   CLINICAL DATA:  Gravida 6, para 4. By LMP, patient is 10 weeks 4 days. Scan performed for dating. Patient is having spotting. AMA.  EXAM: OBSTETRIC <14 WK ULTRASOUND  TRANSVAGINAL ULTRASOUND  TECHNIQUE: Transabdominal and transvaginal ultrasound were performed for evaluation of the gestation as well as the maternal uterus and adnexal regions.  COMPARISON:  None.  FINDINGS: Intrauterine gestational sac: Visualized/normal in shape.  Yolk sac:  Present and prominent, 7.8 mm  Embryo:  Not seen  Cardiac Activity: Not seen  MSD: 14.4  mm   6 w   2  d  Maternal uterus/adnexae: Right corpus luteum cyst is seen. Left ovary has a normal appearance.  IMPRESSION: 1. Intrauterine gestational sac with yolk sac present. Embryo is not identified at this time. 2. Followup ultrasound is suggested in 10-14 days to determine presence of fetal pole and for dating purposes.   Electronically Signed   By: Rosalie Gums M.D.   On: 01/09/2013 14:54    MAU Course  Procedures None  MDM Unable to obtain FHTs Wet prep, GC/Chlamydia, Quant hCG, CBC and [place] today  Assessment and Plan  A: IUGS and YS at [redacted]w[redacted]d Abdominal pain in  pregnancy  P: Discharge home Patient advised to take Tylenol PRN for pain Patient encouraged to make an appointment in [facility] clinic to start prenatal care Patient may return to MAU as needed or if her condition were to change or worsen  Freddi Starr, PA-C  01/09/2013, 4:27 PM

## 2013-01-09 NOTE — MAU Note (Signed)
Patient states she had a little spotting x 2 yesterday, none today. Denies pain.

## 2013-01-10 LAB — GC/CHLAMYDIA PROBE AMP
CT Probe RNA: NEGATIVE
GC Probe RNA: NEGATIVE

## 2013-01-11 NOTE — MAU Provider Note (Signed)
Attestation of Attending Supervision of Advanced Practitioner (CNM/NP): Evaluation and management procedures were performed by the Advanced Practitioner under my supervision and collaboration.  I have reviewed the Advanced Practitioner's note and chart, and I agree with the management and plan.  Faylynn Stamos 01/11/2013 10:33 AM   

## 2013-01-12 ENCOUNTER — Inpatient Hospital Stay (HOSPITAL_COMMUNITY)
Admission: AD | Admit: 2013-01-12 | Discharge: 2013-01-12 | Disposition: A | Discharge status: Home / Self Care | Payer: Medicaid Other | Source: Ambulatory Visit | Attending: Obstetrics & Gynecology | Admitting: Obstetrics & Gynecology

## 2013-01-12 ENCOUNTER — Inpatient Hospital Stay (HOSPITAL_COMMUNITY): Payer: Medicaid Other

## 2013-01-12 ENCOUNTER — Encounter (HOSPITAL_COMMUNITY): Payer: Self-pay | Admitting: *Deleted

## 2013-01-12 DIAGNOSIS — R109 Unspecified abdominal pain: Secondary | ICD-10-CM | POA: Insufficient documentation

## 2013-01-12 DIAGNOSIS — N831 Corpus luteum cyst of ovary, unspecified side: Secondary | ICD-10-CM | POA: Insufficient documentation

## 2013-01-12 DIAGNOSIS — O209 Hemorrhage in early pregnancy, unspecified: Secondary | ICD-10-CM | POA: Insufficient documentation

## 2013-01-12 DIAGNOSIS — O4690 Antepartum hemorrhage, unspecified, unspecified trimester: Secondary | ICD-10-CM

## 2013-01-12 DIAGNOSIS — O34599 Maternal care for other abnormalities of gravid uterus, unspecified trimester: Secondary | ICD-10-CM | POA: Insufficient documentation

## 2013-01-12 LAB — CBC
Hemoglobin: 12.5 g/dL (ref 12.0–15.0)
MCHC: 34.2 g/dL (ref 30.0–36.0)
Platelets: 270 10*3/uL (ref 150–400)
RDW: 12.9 % (ref 11.5–15.5)

## 2013-01-12 LAB — URINALYSIS, ROUTINE W REFLEX MICROSCOPIC
Ketones, ur: 15 mg/dL — AB
Nitrite: NEGATIVE
Protein, ur: NEGATIVE mg/dL
Urobilinogen, UA: 0.2 mg/dL (ref 0.0–1.0)
pH: 6 (ref 5.0–8.0)

## 2013-01-12 LAB — HCG, QUANTITATIVE, PREGNANCY: hCG, Beta Chain, Quant, S: 3993 m[IU]/mL — ABNORMAL HIGH (ref <5)

## 2013-01-12 LAB — URINE MICROSCOPIC-ADD ON

## 2013-01-12 NOTE — MAU Note (Signed)
Patient states that about one hour ago she had bleeding and left side abdominal pain., both have stopped .

## 2013-01-12 NOTE — MAU Provider Note (Signed)
History     CSN: [id number]  Arrival date and time: 01/12/13 4098   First Provider Initiated Contact with Patient 01/12/13 [phone]      Chief Complaint  Patient presents with  . Abdominal Pain  . Vaginal Bleeding   HPI Pt is [redacted] weeks pregnant and presents with bleeding and cramping after being seen on 9/22 with IUGS and YS at that time with c/o of bleeding. Pt's HCG was 6884 on 9/22/ and is 3993 today.  Pt's bleeding and cramping has stopped now.  Rn note: Patient states that about one hour ago she had bleeding and left side abdominal pain., both have stopped   Past Medical History  Diagnosis Date  . Gestational diabetes 12/24/2010    diet controlled    History reviewed. No pertinent past surgical history.  Family History  Problem Relation Age of Onset  . Asthma Mother   . Asthma Maternal Aunt     History  Substance Use Topics  . Smoking status: Never Smoker   . Smokeless tobacco: Never Used  . Alcohol Use: No    Allergies: No Known Allergies  Prescriptions prior to admission  Medication Sig Dispense Refill  . Prenatal Vit-Fe Fumarate-FA (PRENATAL MULTIVITAMIN) TABS tablet Take 1 tablet by mouth daily at 12 noon.        ROS Physical Exam   Blood pressure 106/60, pulse 78, temperature 98.2 F (36.8 C), temperature source Oral, resp. rate 16, last menstrual period 10/27/2012, SpO2 100.00%.  Physical Exam  Vitals reviewed. Constitutional: She is oriented to person, place, and time. She appears well-developed and well-nourished. No distress.  Neck: Normal range of motion.  Musculoskeletal: Normal range of motion.  Neurological: She is alert and oriented to person, place, and time.  Skin: Skin is warm and dry.  Psychiatric: She has a normal mood and affect.    MAU Course  Procedures Discussed with Dr. Macon Large- will repeat ultrasound in 1 week - out patient Results for orders placed during the hospital encounter of 01/12/13 (from the past 24 hour(s))   URINALYSIS, ROUTINE W REFLEX MICROSCOPIC     Status: Abnormal   Collection Time    01/12/13  9:40 AM      Result Value Range   Color, Urine YELLOW  YELLOW   APPearance CLEAR  CLEAR   Specific Gravity, Urine 1.025  1.005 - 1.030   pH 6.0  5.0 - 8.0   Glucose, UA NEGATIVE  NEGATIVE mg/dL   Hgb urine dipstick LARGE (*) NEGATIVE   Bilirubin Urine NEGATIVE  NEGATIVE   Ketones, ur 15 (*) NEGATIVE mg/dL   Protein, ur NEGATIVE  NEGATIVE mg/dL   Urobilinogen, UA 0.2  0.0 - 1.0 mg/dL   Nitrite NEGATIVE  NEGATIVE   Leukocytes, UA SMALL (*) NEGATIVE  URINE MICROSCOPIC-ADD ON     Status: Abnormal   Collection Time    01/12/13  9:40 AM      Result Value Range   Squamous Epithelial / LPF RARE  RARE   WBC, UA 7-10  <3 WBC/hpf   RBC / HPF 0-2  <3 RBC/hpf   Bacteria, UA FEW (*) RARE  CBC     Status: None   Collection Time    01/12/13  9:55 AM      Result Value Range   WBC 9.1  4.0 - 10.5 K/uL   RBC 4.27  3.87 - 5.11 MIL/uL   Hemoglobin 12.5  12.0 - 15.0 g/dL   HCT 47.8  29.5 -  46.0 %   MCV 85.7  78.0 - 100.0 fL   MCH 29.3  26.0 - 34.0 pg   MCHC 34.2  30.0 - 36.0 g/dL   RDW 78.2  95.6 - 21.3 %   Platelets 270  150 - 400 K/uL  HCG, QUANTITATIVE, PREGNANCY     Status: Abnormal   Collection Time    01/12/13  9:55 AM      Result Value Range   hCG, Beta Chain, Quant, S 3993 (*) <5 mIU/mL  US Ob Transvaginal  01/12/2013   CLINICAL DATA:  Pregnant, vaginal bleeding  EXAM: TRANSVAGINAL OB ULTRASOUND  TECHNIQUE: Transvaginal ultrasound was performed for complete evaluation of the gestation as well as the maternal uterus, adnexal regions, and pelvic cul-de-sac.  COMPARISON:  01/09/2013  FINDINGS: Intrauterine gestational sac: Visualized/normal in shape.  Yolk sac:  Present  Embryo:  Not visualized  MSD: 14.4  mm   6 w   2  d  [place] EDC: 09/02/2012  Maternal uterus/adnexae: Small subchorionic hemorrhage.  Right ovary measures 1.5 x 2.9 x 1.7 cm and is notable for a 1.5 cm corpus luteal cyst.  Left ovary  is within normal limits, measuring 1.5 x 2.5 x 2.6 cm.  No free fluid.  IMPRESSION: Intrauterine gestational sac with yolk sac present. Fetal pole is not identified.  Follow-up pelvic ultrasound is suggested in 7-10 days.   Electronically Signed   By: Charline Bills M.D.   On: 01/12/2013 10:53    Assessment and Plan  Bleeding in pregnancy with dropping HCG Discussed probable failed pregnancy F/u 1 week for outpt ultrasound Return sooner if increase pain or bleeding  Moussa Wiegand 01/12/2013, 11:08 AM

## 2013-01-13 LAB — URINE CULTURE

## 2013-01-16 NOTE — MAU Provider Note (Signed)
Attestation of Attending Supervision of Advanced Practitioner (PA/CNM/NP): Evaluation and management procedures were performed by the Advanced Practitioner under my supervision and collaboration.  I have reviewed the Advanced Practitioner's note and chart, and I agree with the management and plan.  Dennison Mcdaid, MD, FACOG Attending Obstetrician & Gynecologist Faculty Practice, [facility]  

## 2013-01-19 ENCOUNTER — Encounter (HOSPITAL_COMMUNITY): Payer: Self-pay | Admitting: *Deleted

## 2013-01-19 ENCOUNTER — Inpatient Hospital Stay (HOSPITAL_COMMUNITY)
Admission: AD | Admit: 2013-01-19 | Discharge: 2013-01-19 | Disposition: A | Discharge status: Home / Self Care | Payer: Medicaid Other | Source: Ambulatory Visit | Attending: Obstetrics & Gynecology | Admitting: Obstetrics & Gynecology

## 2013-01-19 ENCOUNTER — Ambulatory Visit (HOSPITAL_COMMUNITY)
Admission: RE | Admit: 2013-01-19 | Discharge: 2013-01-19 | Disposition: A | Discharge status: Home / Self Care | Payer: Medicaid Other | Source: Ambulatory Visit | Attending: Gynecology | Admitting: Gynecology

## 2013-01-19 DIAGNOSIS — O209 Hemorrhage in early pregnancy, unspecified: Secondary | ICD-10-CM

## 2013-01-19 DIAGNOSIS — O021 Missed abortion: Secondary | ICD-10-CM | POA: Insufficient documentation

## 2013-01-19 MED ORDER — MISOPROSTOL 200 MCG PO TABS
800.0000 ug | ORAL_TABLET | Freq: Once | ORAL | Status: AC
  Administered 2013-01-19: 800 ug via VAGINAL
  Filled 2013-01-19: qty 4

## 2013-01-19 MED ORDER — PROMETHAZINE HCL 12.5 MG PO TABS: 12.5000 mg | ORAL_TABLET | Freq: Four times a day (QID) | ORAL | Status: DC | PRN

## 2013-01-19 MED ORDER — HYDROCODONE-ACETAMINOPHEN 5-325 MG PO TABS: 1.0000 | ORAL_TABLET | Freq: Four times a day (QID) | ORAL | Status: DC | PRN

## 2013-01-19 NOTE — MAU Note (Signed)
Patient to MAU after ultrasound for a failed pregnancy. Patient denies pain but continues to have light bleeding.

## 2013-01-19 NOTE — MAU Note (Signed)
Pt was sent over from ultrasound for intervention

## 2013-01-19 NOTE — MAU Provider Note (Signed)
History     CSN: [id number]  Arrival date and time: 01/19/13 1458   None     Chief Complaint  Patient presents with  . Miscarriage   HPI  Casey Hull is a 42 y.o. [id number] at [redacted]w[redacted]d who presents today for FU ultrasound. She has had serial HCGs and an ultrasound that showed yollk sac 10+ days ago. Ultrasound today shows no progression. She denies any pain or bleeding at this time.   Past Medical History  Diagnosis Date  . Gestational diabetes 12/24/2010    diet controlled    History reviewed. No pertinent past surgical history.  Family History  Problem Relation Age of Onset  . Asthma Mother   . Asthma Maternal Aunt     History  Substance Use Topics  . Smoking status: Never Smoker   . Smokeless tobacco: Never Used  . Alcohol Use: No    Allergies: No Known Allergies  Prescriptions prior to admission  Medication Sig Dispense Refill  . Prenatal Vit-Fe Fumarate-FA (PRENATAL MULTIVITAMIN) TABS tablet Take 1 tablet by mouth daily at 12 noon.        ROS Physical Exam   Blood pressure 100/64, pulse 79, temperature 98.3 F (36.8 C), temperature source Oral, resp. rate 16, last menstrual period 10/27/2012, SpO2 100.00%.  Physical Exam  MAU Course  Procedures  US Ob Transvaginal  01/19/2013   CLINICAL DATA:  Pregnant, bleeding  EXAM: TRANSVAGINAL OB ULTRASOUND  TECHNIQUE: Transvaginal ultrasound was performed for complete evaluation of the gestation as well as the maternal uterus, adnexal regions, and pelvic cul-de-sac.  COMPARISON:  01/09/2013 and 01/12/2013  FINDINGS: Intrauterine gestational sac: Visualized/normal in shape.  Yolk sac:  Enlarged, measuring 8 mm.  Embryo:  Not visualized  MSD: 15  mm   6 w   2  d  [place] EDC: 09/12/2013  Maternal uterus/adnexae: Moderate subchronic hemorrhage.  Bilateral ovaries are within normal limits.  No free fluid.  IMPRESSION: Single intrauterine gestational sac with yolk sac.  Enlarged yolk sac measuring 8 mm. Lack of appropriate interval  growth. Lack of a fetal pole for 10 days following an [place] with gestational sac and yolk sac.  Findings meet definitive criteria for failed pregnancy. This follows SRU consensus guidelines: Diagnostic Criteria for Nonviable Pregnancy Early in the First Trimester. [facility] [phone].  These results were called by telephone at the time of interpretation on 01/19/2013 at 2:46 PM to Wynelle Bourgeois, who verbally acknowledged these results.   Electronically Signed   By: Charline Bills M.D.   On: 01/19/2013 14:47   Results for Casey, Hull (MRN [id number]) as of 01/19/2013 17:01  Ref. Range 01/12/2013 09:55  hCG, Beta Chain, Quant, S Latest Range: <5 mIU/mL 3993 (H)  WBC Latest Range: 4.0-10.5 K/uL 9.1  RBC Latest Range: 3.87-5.11 MIL/uL 4.27  Hemoglobin Latest Range: 12.0-15.0 g/dL 40.1  HCT Latest Range: 36.0-46.0 % 36.6  MCV Latest Range: 78.0-100.0 fL 85.7  MCH Latest Range: 26.0-34.0 pg 29.3  MCHC Latest Range: 30.0-36.0 g/dL 02.7  RDW Latest Range: 11.5-15.5 % 12.9  Platelets Latest Range: 150-400 K/uL 270   1705: C/W Dr. Jolayne Panther OK to have cytotec.        Early Intrauterine Pregnancy Failure  X  Documented intrauterine pregnancy failure less than or equal to [redacted] weeks gestation  X  No serious current illness  X  Baseline Hgb greater than or equal to 10g/dl  X  Patient has easily accessible transportation to the hospital  X  Clear preference  X  Practitioner/physician deems patient reliable  X  Counseling by practitioner or physician  X  Patient education by RN  X  Consent form signed  NA  Rho-Gam given by RN if indicated  ___ Medication dispensed   X  Cytotec 800 mcg  __   Intravaginally by patient at home         X   Intravaginally by RN in MAU        __   Rectally by patient at home        __   Rectally by RN in MAU  X  Ibuprofen 600 mg 1 tablet by mouth every 6 hours as needed #30  X  Hydrocodone/acetaminophen 5/325 mg by mouth every 4 to 6 hours as  needed  X Phenergan 12.5 mg by mouth every 4 hours as needed for nausea     Assessment and Plan   1. Missed abortion    Precautions given Return to MAU as needed FU with the clinic in 2 weeks.   Casey Hull 01/19/2013, 4:55 PM

## 2013-01-24 NOTE — MAU Provider Note (Signed)
Attestation of Attending Supervision of Advanced Practitioner (CNM/NP): Evaluation and management procedures were performed by the Advanced Practitioner under my supervision and collaboration.  I have reviewed the Advanced Practitioner's note and chart, and I agree with the management and plan.  Casey Hull 01/24/2013 10:02 AM

## 2013-02-02 ENCOUNTER — Ambulatory Visit (INDEPENDENT_AMBULATORY_CARE_PROVIDER_SITE_OTHER): Payer: Medicaid Other | Admitting: Obstetrics and Gynecology

## 2013-02-02 ENCOUNTER — Encounter: Payer: Self-pay | Admitting: Obstetrics and Gynecology

## 2013-02-02 VITALS — BP 107/76 | Temp 98.3°F | Ht 64.0 in | Wt 182.8 lb

## 2013-02-02 DIAGNOSIS — O039 Complete or unspecified spontaneous abortion without complication: Secondary | ICD-10-CM

## 2013-02-02 NOTE — Progress Notes (Signed)
Patient ID: Casey Hull, female   DOB: [birth year], 42 y.o.   MRN: [id number] 42 yo [id number] s/p SAB on 10/2 presenting for follow up appointment. Patient opted for medical management with cytotec and reports approximately 4-5 days of vaginal bleeding with occasional spotting. Patient is currently without complaints. This was not a planned pregnancy and she plans on using the rhythm method for contraception  GENERAL: Well-developed, well-nourished female in no acute distress.  ABDOMEN: Soft, nontender, nondistended. No organomegaly. PELVIC: Normal external female genitalia. Vagina is pink and rugated.  Normal discharge. Normal appearing cervix. Uterus is normal in size. No adnexal mass or tenderness. EXTREMITIES: No cyanosis, clubbing, or edema, 2+ distal pulses.  A/P 42 yo s/p spontaneous abortion here for follow up - quant HCG ordered today - patient will be contacted with any abnormal results - rtc for annual exam

## 2013-02-03 LAB — HCG, QUANTITATIVE, PREGNANCY: hCG, Beta Chain, Quant, S: 94.9 m[IU]/mL

## 2013-02-09 ENCOUNTER — Other Ambulatory Visit: Payer: Medicaid Other

## 2013-02-09 ENCOUNTER — Telehealth: Payer: Self-pay | Admitting: *Deleted

## 2013-02-09 DIAGNOSIS — O039 Complete or unspecified spontaneous abortion without complication: Secondary | ICD-10-CM

## 2013-02-09 NOTE — Telephone Encounter (Addendum)
Message copied by Jill Side on Thu Feb 09, 2013 10:51 AM ------      Message from: CONSTANT, Gigi Gin      Created: Fri Feb 03, 2013  8:06 AM       Please have patient return in 1 week for repeat quant HCG ------  Called pt with [facility] interpreter [phone]. I informed pt of decreasing pregnancy hormone level from last week but need to check level again.  Pt voiced understanding and stated that she will come today at 1:30.

## 2013-02-10 LAB — HCG, QUANTITATIVE, PREGNANCY: hCG, Beta Chain, Quant, S: 53.4 m[IU]/mL

## 2013-03-02 ENCOUNTER — Ambulatory Visit (INDEPENDENT_AMBULATORY_CARE_PROVIDER_SITE_OTHER): Payer: Medicaid Other | Admitting: Obstetrics & Gynecology

## 2013-03-02 ENCOUNTER — Encounter: Payer: Self-pay | Admitting: Obstetrics & Gynecology

## 2013-03-02 VITALS — BP 113/74 | HR 83 | Ht 64.0 in | Wt 187.4 lb

## 2013-03-02 DIAGNOSIS — O039 Complete or unspecified spontaneous abortion without complication: Secondary | ICD-10-CM

## 2013-03-02 NOTE — Patient Instructions (Signed)
Return to clinic for any scheduled appointments or for any gynecologic concerns as needed.   

## 2013-03-02 NOTE — Progress Notes (Signed)
GYNECOLOGY CLINIC ENCOUNTER NOTE  History:  42 y.o. [id number] here today for follow up after SAB. Had period like bleeding on 02/27/13.  No other concerns.  Patient is accompanied by Arabic interpreter.  The following portions of the patient's history were reviewed and updated as appropriate: allergies, current medications, past family history, past medical history, past social history, past surgical history and problem list.  Review of Systems:  Pertinent items are noted in HPI.  Objective:  BP 113/74  Pulse 83  Ht 5\' 4"  (1.626 m)  Wt 187 lb 6.4 oz (85.004 kg)  BMI 32.15 kg/m2  LMP 02/20/2013  Breastfeeding? Unknown Physical Exam deferred  Assessment & Plan:  HCG today Routine preventative health maintenance measures emphasized, patient encouraged to go to [facility] or go to free pap smear clinics Will also fill out mammogram scholarship

## 2013-10-11 ENCOUNTER — Emergency Department (HOSPITAL_COMMUNITY)
Admission: EM | Admit: 2013-10-11 | Discharge: 2013-10-11 | Disposition: A | Discharge status: Home / Self Care | Payer: Medicaid Other | Attending: Emergency Medicine | Admitting: Emergency Medicine

## 2013-10-11 ENCOUNTER — Encounter (HOSPITAL_COMMUNITY): Payer: Self-pay | Admitting: Emergency Medicine

## 2013-10-11 DIAGNOSIS — R1013 Epigastric pain: Secondary | ICD-10-CM

## 2013-10-11 DIAGNOSIS — R1012 Left upper quadrant pain: Secondary | ICD-10-CM | POA: Insufficient documentation

## 2013-10-11 DIAGNOSIS — M549 Dorsalgia, unspecified: Secondary | ICD-10-CM | POA: Insufficient documentation

## 2013-10-11 DIAGNOSIS — Z8632 Personal history of gestational diabetes: Secondary | ICD-10-CM | POA: Insufficient documentation

## 2013-10-11 DIAGNOSIS — Z79899 Other long term (current) drug therapy: Secondary | ICD-10-CM | POA: Insufficient documentation

## 2013-10-11 LAB — CBC WITH DIFFERENTIAL/PLATELET
Basophils Absolute: 0 10*3/uL (ref 0.0–0.1)
Basophils Relative: 0 % (ref 0–1)
EOS ABS: 0.1 10*3/uL (ref 0.0–0.7)
EOS PCT: 1 % (ref 0–5)
HCT: 36.5 % (ref 36.0–46.0)
Hemoglobin: 12.5 g/dL (ref 12.0–15.0)
Lymphocytes Relative: 13 % (ref 12–46)
Lymphs Abs: 1.7 10*3/uL (ref 0.7–4.0)
MCH: 29.3 pg (ref 26.0–34.0)
MCHC: 34.2 g/dL (ref 30.0–36.0)
MCV: 85.7 fL (ref 78.0–100.0)
MONOS PCT: 4 % (ref 3–12)
Monocytes Absolute: 0.6 10*3/uL (ref 0.1–1.0)
NEUTROS PCT: 82 % — AB (ref 43–77)
Neutro Abs: 10.6 10*3/uL — ABNORMAL HIGH (ref 1.7–7.7)
PLATELETS: 310 10*3/uL (ref 150–400)
RBC: 4.26 MIL/uL (ref 3.87–5.11)
RDW: 12.8 % (ref 11.5–15.5)
WBC: 13 10*3/uL — ABNORMAL HIGH (ref 4.0–10.5)

## 2013-10-11 LAB — COMPREHENSIVE METABOLIC PANEL
ALBUMIN: 3.7 g/dL (ref 3.5–5.2)
ALT: 11 U/L (ref 0–35)
AST: 15 U/L (ref 0–37)
Alkaline Phosphatase: 56 U/L (ref 39–117)
BUN: 12 mg/dL (ref 6–23)
CALCIUM: 8.7 mg/dL (ref 8.4–10.5)
CO2: 25 mEq/L (ref 19–32)
Chloride: 99 mEq/L (ref 96–112)
Creatinine, Ser: 0.7 mg/dL (ref 0.50–1.10)
GFR calc non Af Amer: >90 mL/min (ref >90)
Glucose, Bld: 129 mg/dL — ABNORMAL HIGH (ref 70–99)
Potassium: 4.1 mEq/L (ref 3.7–5.3)
SODIUM: 137 meq/L (ref 137–147)
TOTAL PROTEIN: 6.9 g/dL (ref 6.0–8.3)
Total Bilirubin: <0.2 mg/dL — ABNORMAL LOW (ref 0.3–1.2)

## 2013-10-11 LAB — LIPASE, BLOOD: LIPASE: 50 U/L (ref 11–59)

## 2013-10-11 MED ORDER — SUCRALFATE 1 G PO TABS: 1.0000 g | ORAL_TABLET | Freq: Four times a day (QID) | ORAL | Status: AC

## 2013-10-11 MED ORDER — PANTOPRAZOLE SODIUM 20 MG PO TBEC: 40.0000 mg | DELAYED_RELEASE_TABLET | Freq: Every day | ORAL | Status: AC

## 2013-10-11 MED ORDER — SODIUM CHLORIDE 0.9 % IV SOLN
Freq: Once | INTRAVENOUS | Status: AC
  Administered 2013-10-11: 04:00:00 via INTRAVENOUS

## 2013-10-11 MED ORDER — FENTANYL CITRATE 0.05 MG/ML IJ SOLN
50.0000 ug | Freq: Once | INTRAMUSCULAR | Status: AC
  Administered 2013-10-11: 50 ug via INTRAVENOUS
  Filled 2013-10-11: qty 2

## 2013-10-11 MED ORDER — ONDANSETRON HCL 4 MG/2ML IJ SOLN
4.0000 mg | Freq: Once | INTRAMUSCULAR | Status: AC
  Administered 2013-10-11: 4 mg via INTRAVENOUS
  Filled 2013-10-11: qty 2

## 2013-10-11 MED ORDER — ONDANSETRON 8 MG PO TBDP: 8.0000 mg | ORAL_TABLET | Freq: Three times a day (TID) | ORAL | Status: AC | PRN

## 2013-10-11 MED ORDER — SUCRALFATE 1 G PO TABS
1.0000 g | ORAL_TABLET | Freq: Once | ORAL | Status: AC
  Administered 2013-10-11: 1 g via ORAL
  Filled 2013-10-11: qty 1

## 2013-10-11 MED ORDER — PANTOPRAZOLE SODIUM 40 MG IV SOLR
40.0000 mg | Freq: Once | INTRAVENOUS | Status: AC
  Administered 2013-10-11: 40 mg via INTRAVENOUS
  Filled 2013-10-11: qty 40

## 2013-10-11 NOTE — ED Notes (Signed)
Pt actively vomiting in triage 

## 2013-10-11 NOTE — ED Provider Notes (Signed)
CSN: [id number]     Arrival date & time 10/11/13  0243 History   First MD Initiated Contact with Patient 10/11/13 [phone]     Chief Complaint  Patient presents with  . Back Pain  . Abdominal Pain     (Consider location/radiation/quality/duration/timing/severity/associated sxs/prior Treatment) HPI 43 year old female presents to emergency room with complaint of mid back pain and upper abdominal pain.  Pain has been ongoing for last 2 days.  Worse tonight with vomiting.  Patient took ibuprofen for her back pain, and then took Prilosec soon afterwards.  No fever or chills.  No unusual foods, no sick contacts.  Patient is currently observing Ramadan, and has been fasting during the day.  No prior abdominal surgeries.   Past Medical History  Diagnosis Date  . Gestational diabetes 12/24/2010    diet controlled   History reviewed. No pertinent past surgical history. Family History  Problem Relation Age of Onset  . Asthma Mother   . Asthma Maternal Aunt    History  Substance Use Topics  . Smoking status: Never Smoker   . Smokeless tobacco: Never Used  . Alcohol Use: No   OB History   Grav Para Term Preterm Abortions TAB SAB Ect Mult Living   6 4 4  1  1   4      Review of Systems  See History of Present Illness; otherwise all other systems are reviewed and negative   Allergies  Review of patient's allergies indicates no known allergies.  Home Medications   Prior to Admission medications   Medication Sig Start Date End Date Taking? Authorizing Levenia Skalicky  ibuprofen (ADVIL,MOTRIN) 600 MG tablet Take 600 mg by mouth 3 (three) times daily as needed for moderate pain.   Yes Historical Ilyana Manuele, MD  Multiple Vitamin (MULTIVITAMIN WITH MINERALS) TABS tablet Take 1 tablet by mouth daily.   Yes Historical Nyeem Stoke, MD  omeprazole (PRILOSEC) 20 MG capsule Take 20 mg by mouth daily.   Yes Historical Koal Eslinger, MD   BP 127/85  Pulse 87  Temp(Src) 97.9 F (36.6 C) (Oral)  Resp 20  SpO2 100%   LMP 10/07/2013  Breastfeeding? No Physical Exam  Nursing note and vitals reviewed. Constitutional: She is oriented to person, place, and time. She appears well-developed and well-nourished.  HENT:  Head: Normocephalic and atraumatic.  Nose: Nose normal.  Mouth/Throat: Oropharynx is clear and moist.  Eyes: Conjunctivae and EOM are normal. Pupils are equal, round, and reactive to light.  Neck: Normal range of motion. Neck supple. No JVD present. No tracheal deviation present. No thyromegaly present.  Cardiovascular: Normal rate, regular rhythm, normal heart sounds and intact distal pulses.  Exam reveals no gallop and no friction rub.   No murmur heard. Pulmonary/Chest: Effort normal and breath sounds normal. No stridor. No respiratory distress. She has no wheezes. She has no rales. She exhibits no tenderness.  Abdominal: Soft. Bowel sounds are normal. She exhibits no distension and no mass. There is tenderness (patient has tenderness in epigastrium.  There is no right upper quadrant tenderness or Murphy sign.  She does have some slight left upper quadrant tenderness.). There is no rebound and no guarding.  Musculoskeletal: Normal range of motion. She exhibits no edema and no tenderness.  Lymphadenopathy:    She has no cervical adenopathy.  Neurological: She is alert and oriented to person, place, and time. She exhibits normal muscle tone. Coordination normal.  Skin: Skin is warm and dry. No rash noted. No erythema. No pallor.  Psychiatric:  She has a normal mood and affect. Her behavior is normal. Judgment and thought content normal.    ED Course  Procedures (including critical care time) Labs Review Labs Reviewed  CBC WITH DIFFERENTIAL - Abnormal; Notable for the following:    WBC 13.0 (*)    Neutrophils Relative % 82 (*)    Neutro Abs 10.6 (*)    All other components within normal limits  COMPREHENSIVE METABOLIC PANEL - Abnormal; Notable for the following:    Glucose, Bld 129 (*)     Total Bilirubin <0.2 (*)    All other components within normal limits  LIPASE, BLOOD    Imaging Review No results found.   EKG Interpretation None      MDM   Final diagnoses:  Epigastric abdominal pain     43 year old female with epigastric pain, back pain.  Differential includes gastritis, pancreatitis, perforated ulcer, among others.  Plan for labs, IV fluids pain and nausea medications.  We'll reassess.    5:35 AM Pt feeling much better, has tolerated PO here.  Suspect gastritis.  Will d/c home with carafate, protonix, zofran   Olivia Mackielga M Otter, MD 10/11/13 [phone]

## 2013-10-11 NOTE — Discharge Instructions (Signed)
Stick to a bland diet until feeling better.  Follow up with a primary care doctor for further workup if symptoms continue.  Return to the ER for worsening condition or new concerning symptoms.   Abdominal Pain Many things can cause abdominal pain. Usually, abdominal pain is not caused by a disease and will improve without treatment. It can often be observed and treated at home. Your health care provider will do a physical exam and possibly order blood tests and X-rays to help determine the seriousness of your pain. However, in many cases, more time must pass before a clear cause of the pain can be found. Before that point, your health care provider may not know if you need more testing or further treatment. HOME CARE INSTRUCTIONS  Monitor your abdominal pain for any changes. The following actions may help to alleviate any discomfort you are experiencing:  Only take over-the-counter or prescription medicines as directed by your health care provider.  Do not take laxatives unless directed to do so by your health care provider.  Try a clear liquid diet (broth, tea, or water) as directed by your health care provider. Slowly move to a bland diet as tolerated. SEEK MEDICAL CARE IF:  You have unexplained abdominal pain.  You have abdominal pain associated with nausea or diarrhea.  You have pain when you urinate or have a bowel movement.  You experience abdominal pain that wakes you in the night.  You have abdominal pain that is worsened or improved by eating food.  You have abdominal pain that is worsened with eating fatty foods.  You have a fever. SEEK IMMEDIATE MEDICAL CARE IF:   Your pain does not go away within 2 hours.  You keep throwing up (vomiting).  Your pain is felt only in portions of the abdomen, such as the right side or the left lower portion of the abdomen.  You pass bloody or black tarry stools. MAKE SURE YOU:  Understand these instructions.   Will watch your  condition.   Will get help right away if you are not doing well or get worse.  Document Released: 01/14/2005 Document Revised: 04/11/2013 Document Reviewed: 12/14/2012 [facility] Patient Information 2015 [place], [place]. This information is not intended to replace advice given to you by your health care provider. Make sure you discuss any questions you have with your health care provider.

## 2013-10-11 NOTE — ED Notes (Signed)
Per family report: pt c/o of back pain that went to her abd. Pt vomited x 2. Pt denies SOB, fevers or chills. Pt took 600mg  of ibuprofen at 20:00 and 1 20mg  omperzole at 23:00 with no relief.

## 2013-12-14 IMAGING — US US OB TRANSVAGINAL
1 series · 14 of 28 positions shown · non-contrast
Comparison: 01/09/2013

CLINICAL DATA: Pregnant, vaginal bleeding

EXAM:
TRANSVAGINAL OB ULTRASOUND
TECHNIQUE: Transvaginal ultrasound was performed for complete evaluation of the
gestation as well as the maternal uterus, adnexal regions, and
pelvic cul-de-sac.

[Series 1: us ob transvaginal · 29 acquisitions, 14 frames shown]
[im 2/29]
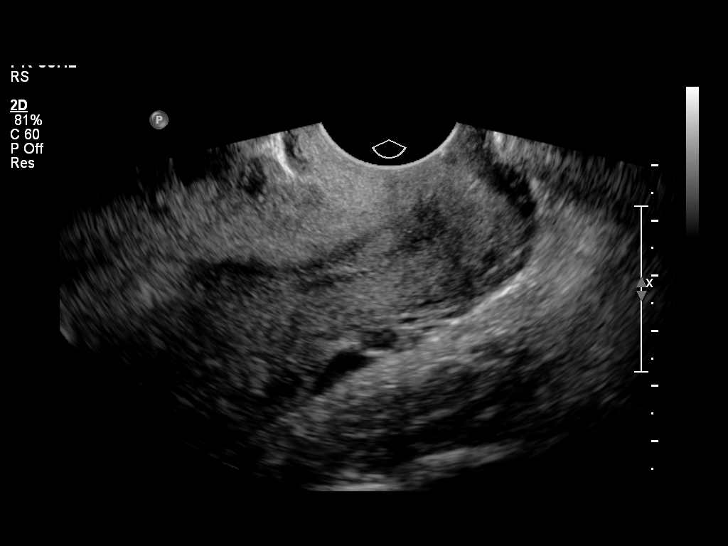
[im 4/29]
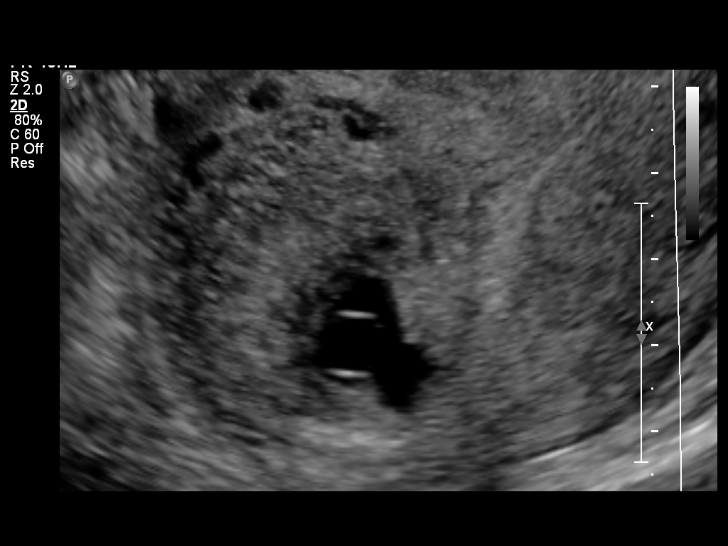
[im 6/29]
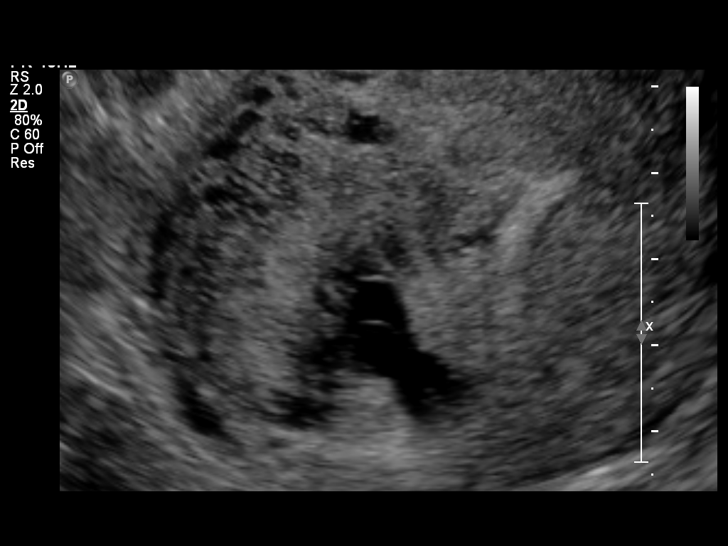
[im 8/29]
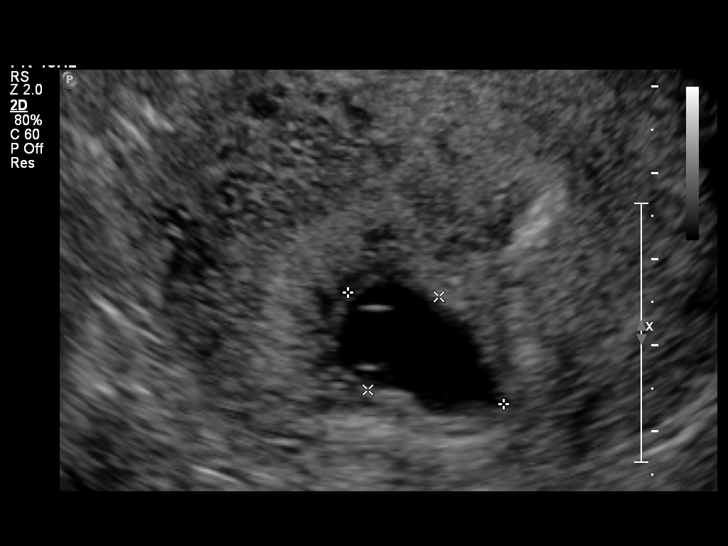
[im 10/29]
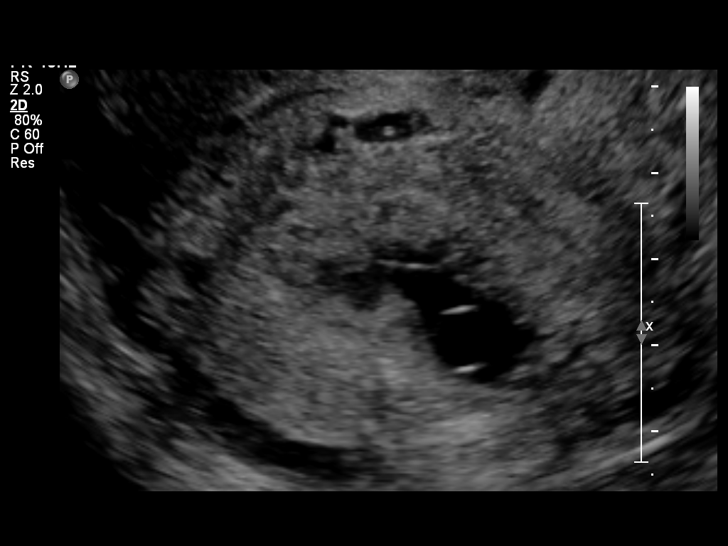
[im 12/29]
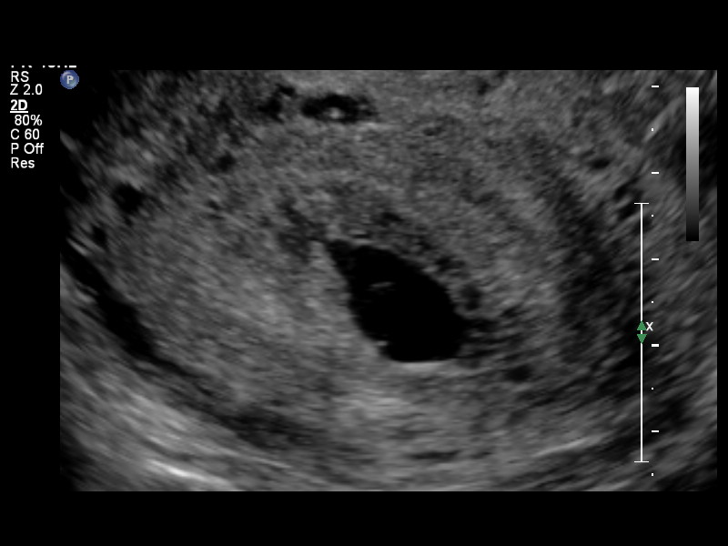
[im 14/29]
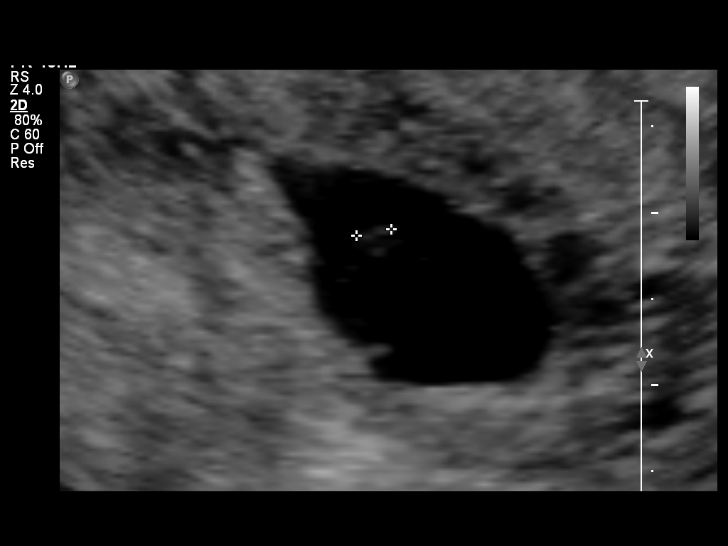
[im 16/29]
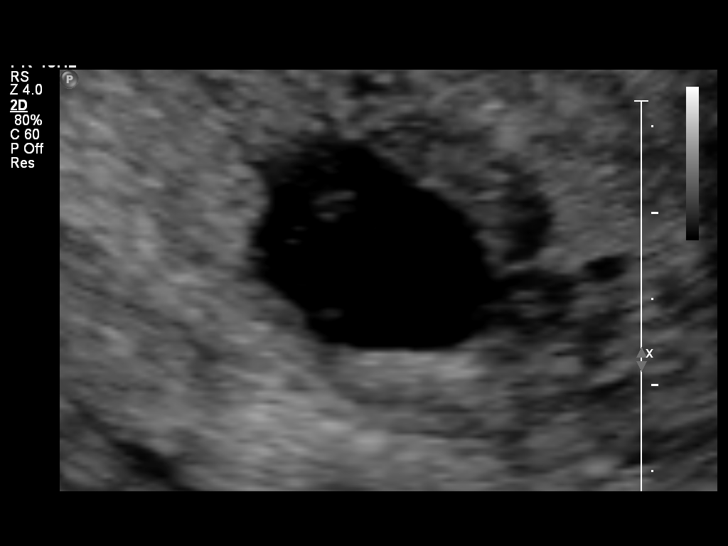
[im 18/29]
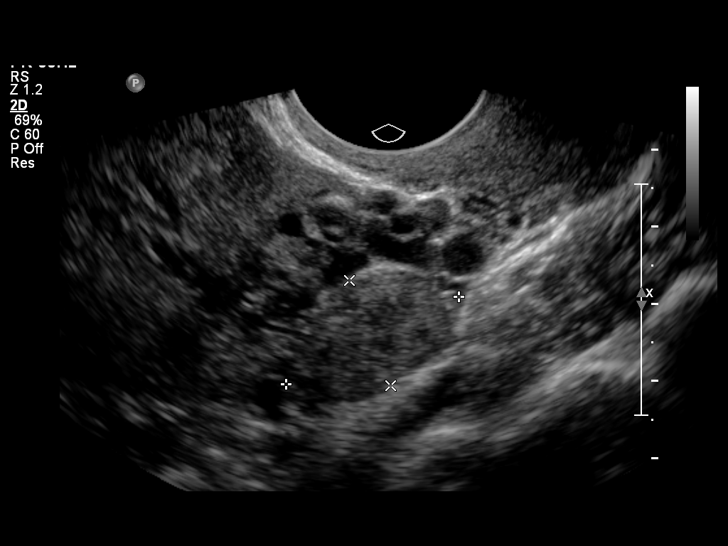
[im 20/29]
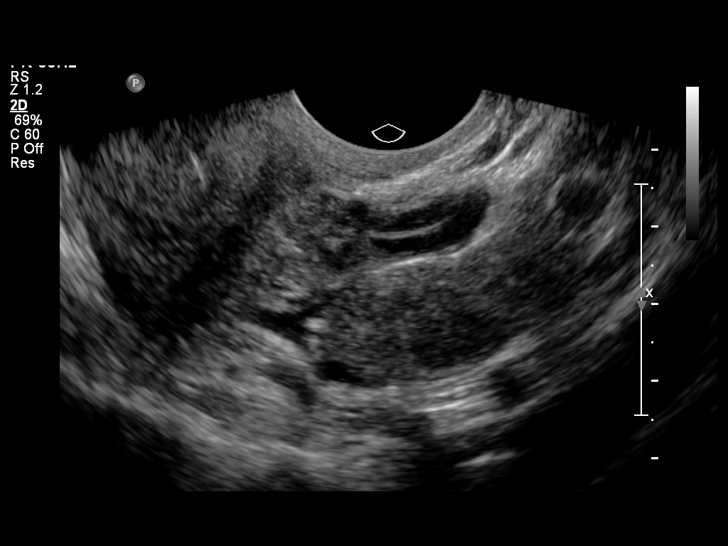
[im 22/29]
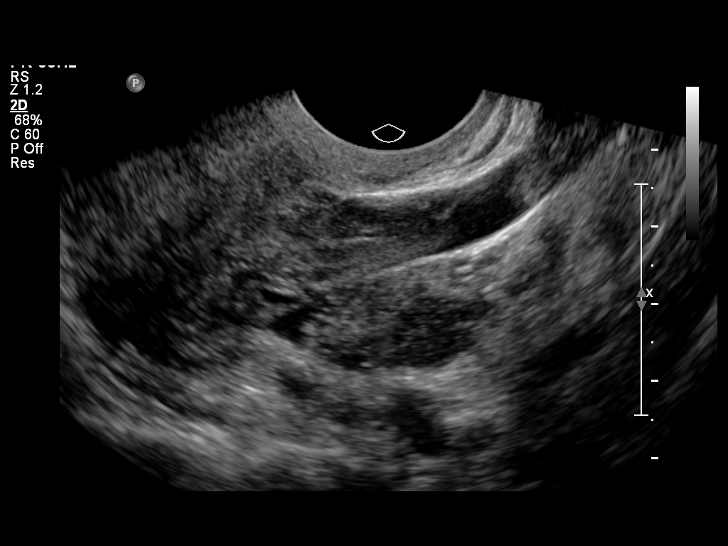
[im 24/29]
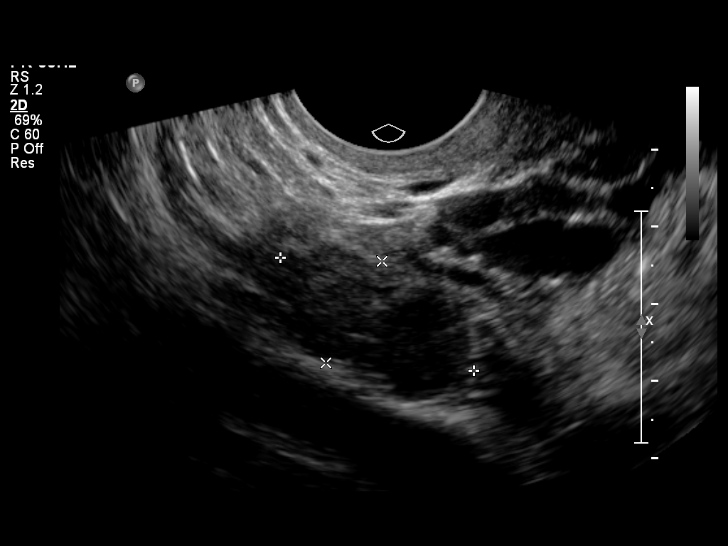
[im 26/29]
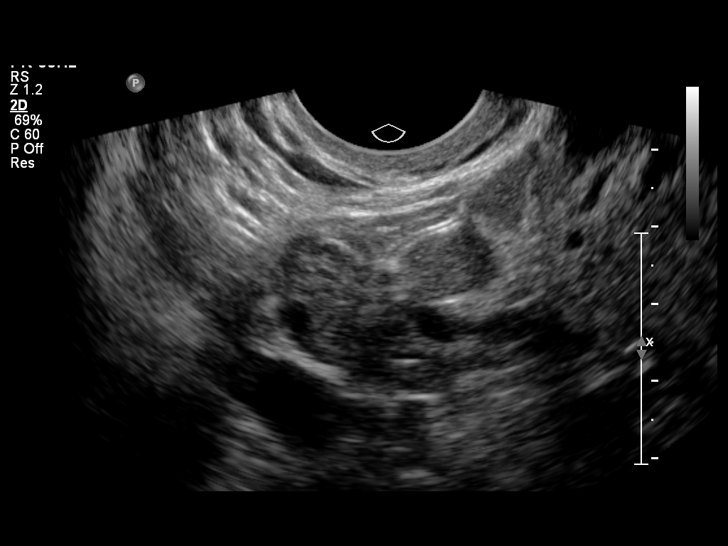
[im 29/29]
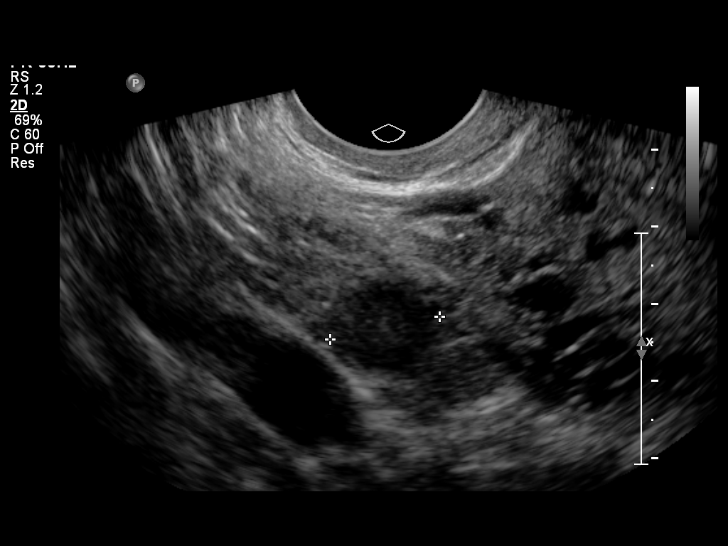

[14 of 28 positions shown; findings below may reference images not displayed]

FINDINGS: Intrauterine gestational sac: Visualized/normal in shape.

Yolk sac:  Present

Embryo:  Not visualized

MSD: 14.4  mm   6 w   2  d

US EDC: 09/02/2012

Maternal uterus/adnexae: Small subchorionic hemorrhage.

Right ovary measures 1.5 x 2.9 x 1.7 cm and is notable for a 1.5 cm
corpus luteal cyst.

Left ovary is within normal limits, measuring 1.5 x 2.5 x 2.6 cm.

No free fluid.
IMPRESSION: Intrauterine gestational sac with yolk sac present. Fetal pole is
not identified.

Follow-up pelvic ultrasound is suggested in 7-10 days.

## 2014-02-19 ENCOUNTER — Encounter (HOSPITAL_COMMUNITY): Payer: Self-pay | Admitting: Emergency Medicine

## 2015-04-15 ENCOUNTER — Emergency Department (HOSPITAL_COMMUNITY)
Admission: EM | Admit: 2015-04-15 | Discharge: 2015-04-15 | Disposition: A | Discharge status: Home / Self Care | Payer: BLUE CROSS/BLUE SHIELD | Attending: Emergency Medicine | Admitting: Emergency Medicine

## 2015-04-15 ENCOUNTER — Emergency Department (HOSPITAL_COMMUNITY): Payer: BLUE CROSS/BLUE SHIELD

## 2015-04-15 ENCOUNTER — Encounter (HOSPITAL_COMMUNITY): Payer: Self-pay

## 2015-04-15 DIAGNOSIS — Z8632 Personal history of gestational diabetes: Secondary | ICD-10-CM | POA: Insufficient documentation

## 2015-04-15 DIAGNOSIS — J159 Unspecified bacterial pneumonia: Secondary | ICD-10-CM | POA: Insufficient documentation

## 2015-04-15 DIAGNOSIS — Z79899 Other long term (current) drug therapy: Secondary | ICD-10-CM | POA: Insufficient documentation

## 2015-04-15 DIAGNOSIS — J189 Pneumonia, unspecified organism: Secondary | ICD-10-CM

## 2015-04-15 LAB — CBC WITH DIFFERENTIAL/PLATELET
BASOS ABS: 0 10*3/uL (ref 0.0–0.1)
Basophils Relative: 0 %
EOS ABS: 0.1 10*3/uL (ref 0.0–0.7)
EOS PCT: 2 %
HCT: 38.9 % (ref 36.0–46.0)
Hemoglobin: 12.4 g/dL (ref 12.0–15.0)
Lymphocytes Relative: 17 %
Lymphs Abs: 1 10*3/uL (ref 0.7–4.0)
MCH: 27.6 pg (ref 26.0–34.0)
MCHC: 31.9 g/dL (ref 30.0–36.0)
MCV: 86.6 fL (ref 78.0–100.0)
Monocytes Absolute: 0.2 10*3/uL (ref 0.1–1.0)
Monocytes Relative: 3 %
Neutro Abs: 4.8 10*3/uL (ref 1.7–7.7)
Neutrophils Relative %: 78 %
PLATELETS: 306 10*3/uL (ref 150–400)
RBC: 4.49 MIL/uL (ref 3.87–5.11)
RDW: 13.4 % (ref 11.5–15.5)
WBC: 6.1 10*3/uL (ref 4.0–10.5)

## 2015-04-15 LAB — COMPREHENSIVE METABOLIC PANEL
ALT: 18 U/L (ref 14–54)
AST: 26 U/L (ref 15–41)
Albumin: 3.6 g/dL (ref 3.5–5.0)
Alkaline Phosphatase: 50 U/L (ref 38–126)
Anion gap: 9 (ref 5–15)
BUN: 7 mg/dL (ref 6–20)
CHLORIDE: 99 mmol/L — AB (ref 101–111)
CO2: 29 mmol/L (ref 22–32)
Calcium: 8.4 mg/dL — ABNORMAL LOW (ref 8.9–10.3)
Creatinine, Ser: 0.65 mg/dL (ref 0.44–1.00)
GFR calc non Af Amer: >60 mL/min (ref >60)
Glucose, Bld: 98 mg/dL (ref 65–99)
POTASSIUM: 3.4 mmol/L — AB (ref 3.5–5.1)
Sodium: 137 mmol/L (ref 135–145)
Total Bilirubin: 0.5 mg/dL (ref 0.3–1.2)
Total Protein: 7 g/dL (ref 6.5–8.1)

## 2015-04-15 LAB — I-STAT CG4 LACTIC ACID, ED
LACTIC ACID, VENOUS: 0.7 mmol/L (ref 0.5–2.0)
LACTIC ACID, VENOUS: 0.69 mmol/L (ref 0.5–2.0)

## 2015-04-15 MED ORDER — LEVOFLOXACIN 500 MG PO TABS: 500.0000 mg | ORAL_TABLET | Freq: Every day | ORAL | Status: AC

## 2015-04-15 MED ORDER — LEVOFLOXACIN IN D5W 500 MG/100ML IV SOLN
500.0000 mg | Freq: Once | INTRAVENOUS | Status: AC
  Administered 2015-04-15: 500 mg via INTRAVENOUS
  Filled 2015-04-15: qty 100

## 2015-04-15 MED ORDER — SODIUM CHLORIDE 0.9 % IV BOLUS (SEPSIS)
1000.0000 mL | Freq: Once | INTRAVENOUS | Status: AC
  Administered 2015-04-15: 1000 mL via INTRAVENOUS

## 2015-04-15 NOTE — ED Provider Notes (Signed)
CSN: [id number]     Arrival date & time 04/15/15  0818 History   First MD Initiated Contact with Patient 04/15/15 [phone]     Chief Complaint  Patient presents with  . Cough  . Fever     (Consider location/radiation/quality/duration/timing/severity/associated sxs/prior Treatment) Patient is a 44 y.o. female presenting with cough and fever. The history is provided by the patient (Patient has had a cough for over a week no fever chills).  Cough Cough characteristics:  Productive Severity:  Moderate Onset quality:  Sudden Timing:  Constant Progression:  Waxing and waning Chronicity:  New Context: not animal exposure   Associated symptoms: fever   Associated symptoms: no chest pain, no eye discharge, no headaches and no rash   Fever Associated symptoms: cough   Associated symptoms: no chest pain, no congestion, no diarrhea, no headaches and no rash     Past Medical History  Diagnosis Date  . Gestational diabetes 12/24/2010    diet controlled   History reviewed. No pertinent past surgical history. Family History  Problem Relation Age of Onset  . Asthma Mother   . Asthma Maternal Aunt    Social History  Substance Use Topics  . Smoking status: Never Smoker   . Smokeless tobacco: Never Used  . Alcohol Use: No   OB History    Gravida Para Term Preterm AB TAB SAB Ectopic Multiple Living   6 4 4  1  1   4      Review of Systems  Constitutional: Positive for fever. Negative for appetite change and fatigue.  HENT: Negative for congestion, ear discharge and sinus pressure.   Eyes: Negative for discharge.  Respiratory: Positive for cough.   Cardiovascular: Negative for chest pain.  Gastrointestinal: Negative for abdominal pain and diarrhea.  Genitourinary: Negative for frequency and hematuria.  Musculoskeletal: Negative for back pain.  Skin: Negative for rash.  Neurological: Negative for seizures and headaches.  Psychiatric/Behavioral: Negative for hallucinations.       Allergies  Review of patient's allergies indicates no known allergies.  Home Medications   Prior to Admission medications   Medication Sig Start Date End Date Taking? Authorizing Provider  acetaminophen (TYLENOL) 500 MG tablet Take 1,000 mg by mouth every 6 (six) hours as needed for moderate pain.   Yes Historical Provider, MD  Dextromethorphan-Guaifenesin (WAL-TUSSIN DM [facility]/CHEST CONG PO) Take 2 capsules by mouth every 4 (four) hours as needed (cold).   Yes Historical Provider, MD  Multiple Vitamin (MULTIVITAMIN WITH MINERALS) TABS tablet Take 1 tablet by mouth daily.   Yes Historical Provider, MD  levofloxacin (LEVAQUIN) 500 MG tablet Take 1 tablet (500 mg total) by mouth daily. 04/15/15   Bethann BerkshireJoseph Benjamim Harnish, MD  ondansetron (ZOFRAN ODT) 8 MG disintegrating tablet Take 1 tablet (8 mg total) by mouth every 8 (eight) hours as needed for nausea or vomiting. Patient not taking: Reported on 04/15/2015 10/11/13   Marisa Severinlga Otter, MD  pantoprazole (PROTONIX) 20 MG tablet Take 2 tablets (40 mg total) by mouth daily. Patient not taking: Reported on 04/15/2015 10/11/13   Marisa Severinlga Otter, MD  sucralfate (CARAFATE) 1 G tablet Take 1 tablet (1 g total) by mouth 4 (four) times daily. Patient not taking: Reported on 04/15/2015 10/11/13   Marisa Severinlga Otter, MD   BP 95/57 mmHg  Pulse 82  Temp(Src) 99.5 F (37.5 C) (Oral)  Resp 18  SpO2 98%  LMP 04/09/2015 Physical Exam  Constitutional: She is oriented to person, place, and time. She appears well-developed.  HENT:  Head: Normocephalic.  Eyes: Conjunctivae and EOM are normal. No scleral icterus.  Neck: Neck supple. No thyromegaly present.  Cardiovascular: Normal rate and regular rhythm.  Exam reveals no gallop and no friction rub.   No murmur heard. Pulmonary/Chest: No stridor. She has no wheezes. She has no rales. She exhibits no tenderness.  Abdominal: She exhibits no distension. There is no tenderness. There is no rebound.  Musculoskeletal: Normal range of  motion. She exhibits no edema.  Lymphadenopathy:    She has no cervical adenopathy.  Neurological: She is oriented to person, place, and time. She exhibits normal muscle tone. Coordination normal.  Skin: No rash noted. No erythema.  Psychiatric: She has a normal mood and affect. Her behavior is normal.    ED Course  Procedures (including critical care time) Labs Review Labs Reviewed  COMPREHENSIVE METABOLIC PANEL - Abnormal; Notable for the following:    Potassium 3.4 (*)    Chloride 99 (*)    Calcium 8.4 (*)    All other components within normal limits  CULTURE, BLOOD (ROUTINE X 2)  CULTURE, BLOOD (ROUTINE X 2)  CBC WITH DIFFERENTIAL/PLATELET  I-STAT CG4 LACTIC ACID, ED  I-STAT CG4 LACTIC ACID, ED    Imaging Review Dg Chest 2 View  04/15/2015  CLINICAL DATA:  Shortness of breath, cough for 5 days EXAM: CHEST  2 VIEW COMPARISON:  None. FINDINGS: The heart size and mediastinal contours are within normal limits. Consolidation of the right lung base is identified. There is mild atelectasis of left lung base. There is no pulmonary edema or pleural effusion. There is a hiatal hernia. The visualized skeletal structures are unremarkable. IMPRESSION: Right lung base pneumonia. Electronically Signed   By: Sherian Rein M.D.   On: 04/15/2015 09:19   I have personally reviewed and evaluated these images and lab results as part of my medical decision-making.   EKG Interpretation None      MDM   Final diagnoses:  Community acquired pneumonia    Community acquired pneumonia. Patient will be treated with Levaquin and will follow-up with PCP this week    Bethann Berkshire, MD 04/15/15 1430

## 2015-04-15 NOTE — ED Notes (Signed)
Pt with cough and intermittent fever x 1 week.  Productive at times.  Headache with cough.  Back pain with cough.  Fever as high as 102 at home.  Currently 99.6.  Pt taking tylenol sinus for fever and DM cough medicine with little relief.

## 2015-04-15 NOTE — Discharge Instructions (Signed)
Follow up with a family md in 3-4 days for recheck

## 2015-04-15 NOTE — ED Notes (Signed)
MD at bedside. 

## 2015-04-20 LAB — CULTURE, BLOOD (ROUTINE X 2)
Culture: NO GROWTH
Culture: NO GROWTH

## 2021-12-26 ENCOUNTER — Telehealth: Payer: Self-pay

## 2021-12-26 NOTE — Telephone Encounter (Signed)
Telephoned patient at mobile number. Language line interpreter#[phone]. Left voice message with [facility] contact information.

## 2022-06-29 ENCOUNTER — Encounter: Payer: BLUE CROSS/BLUE SHIELD | Admitting: Sports Medicine
